# Patient Record
Sex: Female | Born: 1990 | Hispanic: No | Marital: Single | Smoking: Never smoker
Health system: Southern US, Community
[De-identification: ages and names within clinical notes are randomized; demographics above are authoritative.]

## PROBLEM LIST (undated history)

## (undated) DIAGNOSIS — F909 Attention-deficit hyperactivity disorder, unspecified type: Secondary | ICD-10-CM

## (undated) DIAGNOSIS — F329 Major depressive disorder, single episode, unspecified: Secondary | ICD-10-CM

## (undated) DIAGNOSIS — F419 Anxiety disorder, unspecified: Secondary | ICD-10-CM

## (undated) DIAGNOSIS — F32A Depression, unspecified: Secondary | ICD-10-CM

## (undated) HISTORY — DX: Anxiety disorder, unspecified: F41.9

## (undated) HISTORY — DX: Depression, unspecified: F32.A

## (undated) HISTORY — PX: TONSILLECTOMY: SUR1361

## (undated) HISTORY — DX: Major depressive disorder, single episode, unspecified: F32.9

---
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
## 2013-08-08 ENCOUNTER — Encounter: Payer: Self-pay | Admitting: Obstetrics and Gynecology

## 2013-08-08 ENCOUNTER — Encounter: Payer: Self-pay | Admitting: Maternal and Fetal Medicine

## 2013-08-18 ENCOUNTER — Emergency Department: Payer: Self-pay | Admitting: Emergency Medicine

## 2013-08-18 LAB — URINALYSIS, COMPLETE
Bilirubin,UR: NEGATIVE
Glucose,UR: NEGATIVE mg/dL (ref 0–75)
Ph: 5 (ref 4.5–8.0)
RBC,UR: 44 /HPF (ref 0–5)
WBC UR: 126 /HPF (ref 0–5)

## 2013-08-18 LAB — CBC
MCH: 31.2 pg (ref 26.0–34.0)
MCHC: 34.3 g/dL (ref 32.0–36.0)
Platelet: 252 10*3/uL (ref 150–440)
RBC: 4.11 10*6/uL (ref 3.80–5.20)
RDW: 13.7 % (ref 11.5–14.5)
WBC: 10.7 10*3/uL (ref 3.6–11.0)

## 2014-12-26 NOTE — Consult Note (Signed)
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   Past Obstetrical Hx spontaneous vaginal delivery uncomplicated term delivery 7lbs 12oz   Home Medications:  Medication Instructions Status  Prenatal Multivitamins Prenatal Multivitamins oral tablet 1 tab(s) orally once a day Active   Allergies:   No Known Allergies:   Vital Signs/Notes:  Nursing Vital Signs: **Vital Signs.:   04-Dec-14 14:11  Vital Signs Type Admission  Temperature Temperature (F) 97.7  Celsius 36.5  Temperature Source oral  Pulse Pulse 92  Respirations Respirations 18  Systolic BP Systolic BP 101  Diastolic BP (mmHg) Diastolic BP (mmHg) 66  Mean BP 77  Pulse Ox % Pulse Ox % 99  Pulse Ox Activity Level  At rest  Oxygen Delivery Room Air/ 21 %   Perinatal Consult:  PMed Hx Rubella Immune, Hx of varicella   Past Medical History cont'd s/p flu shot   PSurg Hx T&A   Impression/Recommendations:  Impression IUP at 3038w1d  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 I did  a literature search on pregnancy outcomes after nonsurgical  treatment for cornual  heterotopic pregnancies  - There is a paper in Fertility and Sterility ( 2009 ; 91:e11-e13) from [place] on expectant mgt after an early demise of the heterotpic pregnancy - at time of cesarean for dystocia there was concern that the ectopic was in a state of "pre-rupture" . I advised her that there is not a great deal of experience with this - I think reasonable to attempt a vaginal delivery but manage like a TOLAC with consideration for IV access, type and screen in blood bank and anesthesia availability .   Plan:  Prenatal Diagnosis Options First trimester   Ultrasound at what gestational ages 4528 and 6634 weeks    Total Time Spent with Patient 15 minutes   >50% of visit spent in couseling/coordination of care yes   Office Use Only [id number]  Level 1 (15min) NEW office consult prob focused   Coding Description: MATERNAL CONDITIONS/HISTORY INDICATION(S).   OTHER: heterotopic pregnancy.  Electronic Signatures: Rondall AllegraLivingston, Anedra Penafiel Gresham (MD)  (Signed 04-Dec-14 16:36)  Authored: Referral, Home Medications, Allergies, Vital Signs/Notes, Consult, Impression, Plan, Billing, Coding Description   Last Updated: 04-Dec-14 16:36 by Rondall AllegraLivingston, Mckaylah Bettendorf Gresham (MD)

## 2016-11-30 ENCOUNTER — Emergency Department: Payer: Self-pay

## 2016-11-30 ENCOUNTER — Inpatient Hospital Stay
Admission: EM | Admit: 2016-11-30 | Discharge: 2016-12-03 | DRG: 872 | Disposition: A | Discharge status: Home / Self Care | Payer: Self-pay | Attending: Internal Medicine | Admitting: Internal Medicine

## 2016-11-30 ENCOUNTER — Encounter: Payer: Self-pay | Admitting: Emergency Medicine

## 2016-11-30 DIAGNOSIS — R109 Unspecified abdominal pain: Secondary | ICD-10-CM

## 2016-11-30 DIAGNOSIS — Z79899 Other long term (current) drug therapy: Secondary | ICD-10-CM

## 2016-11-30 DIAGNOSIS — N390 Urinary tract infection, site not specified: Secondary | ICD-10-CM

## 2016-11-30 DIAGNOSIS — N12 Tubulo-interstitial nephritis, not specified as acute or chronic: Secondary | ICD-10-CM

## 2016-11-30 DIAGNOSIS — A419 Sepsis, unspecified organism: Principal | ICD-10-CM | POA: Diagnosis present

## 2016-11-30 DIAGNOSIS — Z87442 Personal history of urinary calculi: Secondary | ICD-10-CM

## 2016-11-30 DIAGNOSIS — N10 Acute tubulo-interstitial nephritis: Secondary | ICD-10-CM | POA: Diagnosis present

## 2016-11-30 DIAGNOSIS — E876 Hypokalemia: Secondary | ICD-10-CM | POA: Diagnosis present

## 2016-11-30 DIAGNOSIS — Z87440 Personal history of urinary (tract) infections: Secondary | ICD-10-CM

## 2016-11-30 DIAGNOSIS — M549 Dorsalgia, unspecified: Secondary | ICD-10-CM | POA: Diagnosis present

## 2016-11-30 DIAGNOSIS — F909 Attention-deficit hyperactivity disorder, unspecified type: Secondary | ICD-10-CM | POA: Diagnosis present

## 2016-11-30 HISTORY — DX: Attention-deficit hyperactivity disorder, unspecified type: F90.9

## 2016-11-30 LAB — CBC WITH DIFFERENTIAL/PLATELET
BASOS ABS: 0 10*3/uL (ref 0–0.1)
Basophils Relative: 0 %
EOS ABS: 0 10*3/uL (ref 0–0.7)
EOS PCT: 0 %
HCT: 38.7 % (ref 35.0–47.0)
HEMOGLOBIN: 13.4 g/dL (ref 12.0–16.0)
LYMPHS ABS: 1.2 10*3/uL (ref 1.0–3.6)
LYMPHS PCT: 6 %
MCH: 30.5 pg (ref 26.0–34.0)
MCHC: 34.7 g/dL (ref 32.0–36.0)
MCV: 87.8 fL (ref 80.0–100.0)
Monocytes Absolute: 1.5 10*3/uL — ABNORMAL HIGH (ref 0.2–0.9)
Monocytes Relative: 8 %
NEUTROS PCT: 86 %
Neutro Abs: 16.4 10*3/uL — ABNORMAL HIGH (ref 1.4–6.5)
PLATELETS: 370 10*3/uL (ref 150–440)
RBC: 4.4 MIL/uL (ref 3.80–5.20)
RDW: 13.2 % (ref 11.5–14.5)
WBC: 19.2 10*3/uL — AB (ref 3.6–11.0)

## 2016-11-30 LAB — LACTIC ACID, PLASMA: LACTIC ACID, VENOUS: 1.8 mmol/L (ref 0.5–1.9)

## 2016-11-30 LAB — COMPREHENSIVE METABOLIC PANEL
ALT: 26 U/L (ref 14–54)
ANION GAP: 9 (ref 5–15)
AST: 43 U/L — ABNORMAL HIGH (ref 15–41)
Albumin: 3.9 g/dL (ref 3.5–5.0)
Alkaline Phosphatase: 60 U/L (ref 38–126)
BUN: 11 mg/dL (ref 6–20)
CALCIUM: 8.9 mg/dL (ref 8.9–10.3)
CHLORIDE: 104 mmol/L (ref 101–111)
CO2: 22 mmol/L (ref 22–32)
CREATININE: 0.98 mg/dL (ref 0.44–1.00)
Glucose, Bld: 149 mg/dL — ABNORMAL HIGH (ref 65–99)
Potassium: 3.9 mmol/L (ref 3.5–5.1)
SODIUM: 135 mmol/L (ref 135–145)
Total Bilirubin: 0.5 mg/dL (ref 0.3–1.2)
Total Protein: 8.2 g/dL — ABNORMAL HIGH (ref 6.5–8.1)

## 2016-11-30 LAB — URINALYSIS, COMPLETE (UACMP) WITH MICROSCOPIC
BILIRUBIN URINE: NEGATIVE
Bacteria, UA: NONE SEEN
Glucose, UA: NEGATIVE mg/dL
Ketones, ur: NEGATIVE mg/dL
Nitrite: POSITIVE — AB
Protein, ur: 30 mg/dL — AB
SPECIFIC GRAVITY, URINE: 1.019 (ref 1.005–1.030)
pH: 7 (ref 5.0–8.0)

## 2016-11-30 MED ORDER — CEFTRIAXONE SODIUM-DEXTROSE 1-3.74 GM-% IV SOLR
1.0000 g | Freq: Once | INTRAVENOUS | Status: AC
  Administered 2016-11-30: 1 g via INTRAVENOUS
  Filled 2016-11-30: qty 50

## 2016-11-30 MED ORDER — SODIUM CHLORIDE 0.9 % IV BOLUS (SEPSIS)
1000.0000 mL | Freq: Once | INTRAVENOUS | Status: AC
  Administered 2016-11-30: 1000 mL via INTRAVENOUS

## 2016-11-30 MED ORDER — FENTANYL CITRATE (PF) 100 MCG/2ML IJ SOLN
INTRAMUSCULAR | Status: AC
  Administered 2016-11-30: 50 ug via INTRAVENOUS
  Filled 2016-11-30: qty 2

## 2016-11-30 MED ORDER — DEXTROSE 5 % IV SOLN: 1.0000 g | Freq: Once | INTRAVENOUS | Status: DC

## 2016-11-30 MED ORDER — ACETAMINOPHEN 500 MG PO TABS
1000.0000 mg | ORAL_TABLET | Freq: Once | ORAL | Status: AC
Start: 2016-11-30 — End: 2016-11-30
  Administered 2016-11-30: 1000 mg via ORAL
  Filled 2016-11-30: qty 2

## 2016-11-30 MED ORDER — MORPHINE SULFATE (PF) 4 MG/ML IV SOLN
4.0000 mg | INTRAVENOUS | Status: DC | PRN
  Administered 2016-11-30: 4 mg via INTRAVENOUS
  Filled 2016-11-30: qty 1

## 2016-11-30 MED ORDER — FENTANYL CITRATE (PF) 100 MCG/2ML IJ SOLN
50.0000 ug | INTRAMUSCULAR | Status: DC | PRN
  Administered 2016-11-30: 50 ug via INTRAVENOUS

## 2016-11-30 MED ORDER — ONDANSETRON HCL 4 MG/2ML IJ SOLN
4.0000 mg | Freq: Once | INTRAMUSCULAR | Status: AC
  Administered 2016-11-30: 4 mg via INTRAVENOUS
  Filled 2016-11-30: qty 2

## 2016-11-30 NOTE — ED Triage Notes (Addendum)
Patient ambulatory to triage with steady gait, without difficulty or distress noted, mask in place; pt reports right flank pain, fever and body aches since last night; st hx kidney infections; took advil 2 tab PTA; charge nurse notified; pt taken to room 5 and placed in hosp gown & on card monitor for further eval; report given to care nurse Vernona RiegerLaura, RN and MD Dr Roxan Hockeyobinson

## 2016-11-30 NOTE — ED Notes (Signed)
Pt transported to Ultrasound.  

## 2016-11-30 NOTE — ED Notes (Signed)
Blood culture draw #1: RIGHT hand; Blood culture draw #2 LEFT hand.

## 2016-11-30 NOTE — ED Notes (Signed)
ED Provider at bedside. 

## 2016-11-30 NOTE — ED Provider Notes (Signed)
[facility] Emergency Department Provider Note    None    (approximate)  I have reviewed the triage vital signs and the nursing notes.   HISTORY  Chief Complaint Fever and Back Pain    HPI Megan Floyd is a 26 y.o. female Modena Jansky with fever draws malaise and acute worsening of right flank pain. Patient states that she's had recurrent UTIs in the past several months. States she's been giving medications of antibiotics but does not remember the name of them. States she also has a history of kidney stone back in 2012.  Patient stayed home from work today due to worsening malaise and pain.   Reported K recheck her this afternoon. Found her to be febrile to 104 and ill-appearing.   History reviewed. No pertinent past medical history. No family history on file. Past Surgical History:  Procedure Laterality Date  . TONSILLECTOMY     There are no active problems to display for this patient.     Prior to Admission medications   Not on File    Allergies Patient has no known allergies.    Social History Social History  Substance Use Topics  . Smoking status: Never Smoker  . Smokeless tobacco: Never Used  . Alcohol use No    Review of Systems Patient denies headaches, rhinorrhea, blurry vision, numbness, shortness of breath, chest pain, edema, cough, abdominal pain, nausea, vomiting, diarrhea, dysuria, fevers, rashes or hallucinations unless otherwise stated above in HPI. ____________________________________________   PHYSICAL EXAM:  VITAL SIGNS: Vitals:   11/30/16 2230 11/30/16 2300  BP: (!) 90/55 99/77  Pulse: (!) 118 (!) 126  Resp: (!) 23 (!) 23  Temp:      Constitutional: Alert and oriented. ill appearing but in no acute distress. Eyes: Conjunctivae are normal. PERRL. EOMI. Head: Atraumatic. Nose: No congestion/rhinnorhea. Mouth/Throat: Mucous membranes are moist.  Oropharynx non-erythematous. Neck: No stridor. Painless ROM. No  cervical spine tenderness to palpation Hematological/Lymphatic/Immunilogical: No cervical lymphadenopathy. Cardiovascular: tachycardic, regular rhythm. Grossly normal heart sounds.  Good peripheral circulation. Respiratory: Normal respiratory effort.  No retractions. Lungs CTAB. Gastrointestinal: Soft and nontender. No distention. No abdominal bruits. + right CVA tenderness. Musculoskeletal: No lower extremity tenderness nor edema.  No joint effusions. Neurologic:  Normal speech and language. No gross focal neurologic deficits are appreciated. No gait instability. Skin:  Skin is warm, dry and intact. No rash noted. Psychiatric: Mood and affect are normal. Speech and behavior are normal.  ____________________________________________   LABS (all labs ordered are listed, but only abnormal results are displayed)  Results for orders placed or performed during the hospital encounter of 11/30/16 (from the past 24 hour(s))  Urinalysis, Complete w Microscopic     Status: Abnormal   Collection Time: 11/30/16  8:43 PM  Result Value Ref Range   Color, Urine AMBER (A) YELLOW   APPearance HAZY (A) CLEAR   Specific Gravity, Urine 1.019 1.005 - 1.030   pH 7.0 5.0 - 8.0   Glucose, UA NEGATIVE NEGATIVE mg/dL   Hgb urine dipstick SMALL (A) NEGATIVE   Bilirubin Urine NEGATIVE NEGATIVE   Ketones, ur NEGATIVE NEGATIVE mg/dL   Protein, ur 30 (A) NEGATIVE mg/dL   Nitrite POSITIVE (A) NEGATIVE   Leukocytes, UA MODERATE (A) NEGATIVE   RBC / HPF 6-30 0 - 5 RBC/hpf   WBC, UA TOO NUMEROUS TO COUNT 0 - 5 WBC/hpf   Bacteria, UA NONE SEEN NONE SEEN   Squamous Epithelial / LPF 6-30 (A) NONE SEEN  Mucous PRESENT    Non Squamous Epithelial 0-5 (A) NONE SEEN  Comprehensive metabolic panel     Status: Abnormal   Collection Time: 11/30/16  9:03 PM  Result Value Ref Range   Sodium 135 135 - 145 mmol/L   Potassium 3.9 3.5 - 5.1 mmol/L   Chloride 104 101 - 111 mmol/L   CO2 22 22 - 32 mmol/L   Glucose, Bld 149 (H)  65 - 99 mg/dL   BUN 11 6 - 20 mg/dL   Creatinine, Ser 4.09 0.44 - 1.00 mg/dL   Calcium 8.9 8.9 - 81.1 mg/dL   Total Protein 8.2 (H) 6.5 - 8.1 g/dL   Albumin 3.9 3.5 - 5.0 g/dL   AST 43 (H) 15 - 41 U/L   ALT 26 14 - 54 U/L   Alkaline Phosphatase 60 38 - 126 U/L   Total Bilirubin 0.5 0.3 - 1.2 mg/dL   GFR calc non Af Amer >60 >60 mL/min   GFR calc Af Amer >60 >60 mL/min   Anion gap 9 5 - 15  Lactic acid, plasma     Status: None   Collection Time: 11/30/16  9:03 PM  Result Value Ref Range   Lactic Acid, Venous 1.8 0.5 - 1.9 mmol/L  CBC with Differential     Status: Abnormal   Collection Time: 11/30/16  9:03 PM  Result Value Ref Range   WBC 19.2 (H) 3.6 - 11.0 K/uL   RBC 4.40 3.80 - 5.20 MIL/uL   Hemoglobin 13.4 12.0 - 16.0 g/dL   HCT 91.4 78.2 - 95.6 %   MCV 87.8 80.0 - 100.0 fL   MCH 30.5 26.0 - 34.0 pg   MCHC 34.7 32.0 - 36.0 g/dL   RDW 21.3 08.6 - 57.8 %   Platelets 370 150 - 440 K/uL   Neutrophils Relative % 86 %   Neutro Abs 16.4 (H) 1.4 - 6.5 K/uL   Lymphocytes Relative 6 %   Lymphs Abs 1.2 1.0 - 3.6 K/uL   Monocytes Relative 8 %   Monocytes Absolute 1.5 (H) 0.2 - 0.9 K/uL   Eosinophils Relative 0 %   Eosinophils Absolute 0.0 0 - 0.7 K/uL   Basophils Relative 0 %   Basophils Absolute 0.0 0 - 0.1 K/uL   ____________________________________________  EKG My review and personal interpretation at Time: 20:57   Indication: tachycardia  Rate: 140  Rhythm: sinus Axis: normal Other: normal intervals, no st elevations or depressions ____________________________________________  RADIOLOGY  I personally reviewed all radiographic images ordered to evaluate for the above acute complaints and reviewed radiology reports and findings.  These findings were personally discussed with the patient.  Please see medical record for radiology report. ____________________________________________   PROCEDURES  Procedure(s) performed:  Procedures    Critical Care performed:  yes CRITICAL CARE Performed by: Willy Eddy   Total critical care time: 30 minutes  Critical care time was exclusive of separately billable procedures and treating other patients.  Critical care was necessary to treat or prevent imminent or life-threatening deterioration.  Critical care was time spent personally by me on the following activities: development of treatment plan with patient and/or surrogate as well as nursing, discussions with consultants, evaluation of patient's response to treatment, examination of patient, obtaining history from patient or surrogate, ordering and performing treatments and interventions, ordering and review of laboratory studies, ordering and review of radiographic studies, pulse oximetry and re-evaluation of patient's condition.  ____________________________________________   INITIAL IMPRESSION / ASSESSMENT AND PLAN /  ED COURSE  Pertinent labs & imaging results that were available during my care of the patient were reviewed by me and considered in my medical decision making (see chart for details).  DDX: pyelo, stone, colitis, flu,   Tammi SouDanielle J Lauderback is a 26 y.o. who presents to the ED with acute flank pain of fever and tachycardia as described above. Patient ill-appearing. Has been having recurrent UTIs and symptoms consistent with UTI. Urinalysis shows evidence of pyuria that is both nitrate positive and leuk esterase positive.  His presentation isn't septic workup was initiated. Ultrasound was ordered to evaluate for any evidence of hydronephrosis. KUB also ordered to evaluate for any evidence of stone. There is no evidence of hydronephrosis or kidney stone. Her abdominal exam soft and benign. Do not feel CT imaging clinically indicated at this time. Given her acute leukocytosis as well as febrile illness and concern for sepsis secondary to UTI with pyelonephritis. Patient given IV antibiotics and multiple doses of IV fluids. Patient with persistent  tachycardia and soft blood pressures but lactate is normal.  Do feel patient will need admission for further evaluation and management.  Have discussed with the patient and available family all diagnostics and treatments performed thus far and all questions were answered to the best of my ability. The patient demonstrates understanding and agreement with plan.       ____________________________________________   FINAL CLINICAL IMPRESSION(S) / ED DIAGNOSES  Final diagnoses:  Sepsis, due to unspecified organism [facility](HCC)  Pyelonephritis      NEW MEDICATIONS STARTED DURING THIS VISIT:  New Prescriptions   No medications on file     Note:  This document was prepared using Dragon voice recognition software and may include unintentional dictation errors.    Willy EddyPatrick Adamariz Gillott, MD 11/30/16 [phone]

## 2016-12-01 ENCOUNTER — Inpatient Hospital Stay: Payer: Self-pay

## 2016-12-01 DIAGNOSIS — A419 Sepsis, unspecified organism: Secondary | ICD-10-CM

## 2016-12-01 DIAGNOSIS — N390 Urinary tract infection, site not specified: Secondary | ICD-10-CM

## 2016-12-01 HISTORY — DX: Urinary tract infection, site not specified: A41.9

## 2016-12-01 LAB — APTT: aPTT: 37 seconds — ABNORMAL HIGH (ref 24–36)

## 2016-12-01 LAB — CBC WITH DIFFERENTIAL/PLATELET
BASOS ABS: 0.1 10*3/uL (ref 0–0.1)
BASOS PCT: 0 %
Eosinophils Absolute: 0 10*3/uL (ref 0–0.7)
Eosinophils Relative: 0 %
HEMATOCRIT: 31.9 % — AB (ref 35.0–47.0)
Hemoglobin: 11 g/dL — ABNORMAL LOW (ref 12.0–16.0)
LYMPHS PCT: 12 %
Lymphs Abs: 2.1 10*3/uL (ref 1.0–3.6)
MCH: 30.8 pg (ref 26.0–34.0)
MCHC: 34.6 g/dL (ref 32.0–36.0)
MCV: 89.2 fL (ref 80.0–100.0)
MONOS PCT: 10 %
Monocytes Absolute: 1.6 10*3/uL — ABNORMAL HIGH (ref 0.2–0.9)
NEUTROS ABS: 13.4 10*3/uL — AB (ref 1.4–6.5)
NEUTROS PCT: 78 %
Platelets: 284 10*3/uL (ref 150–440)
RBC: 3.58 MIL/uL — ABNORMAL LOW (ref 3.80–5.20)
RDW: 12.9 % (ref 11.5–14.5)
WBC: 17.2 10*3/uL — ABNORMAL HIGH (ref 3.6–11.0)

## 2016-12-01 LAB — COMPREHENSIVE METABOLIC PANEL
ALBUMIN: 3 g/dL — AB (ref 3.5–5.0)
ALK PHOS: 42 U/L (ref 38–126)
ALT: 17 U/L (ref 14–54)
AST: 25 U/L (ref 15–41)
Anion gap: 5 (ref 5–15)
BILIRUBIN TOTAL: 0.6 mg/dL (ref 0.3–1.2)
BUN: 7 mg/dL (ref 6–20)
CALCIUM: 7.4 mg/dL — AB (ref 8.9–10.3)
CO2: 23 mmol/L (ref 22–32)
Chloride: 110 mmol/L (ref 101–111)
Creatinine, Ser: 0.85 mg/dL (ref 0.44–1.00)
GFR calc Af Amer: >60 mL/min (ref >60)
GLUCOSE: 124 mg/dL — AB (ref 65–99)
Potassium: 3.3 mmol/L — ABNORMAL LOW (ref 3.5–5.1)
Sodium: 138 mmol/L (ref 135–145)
TOTAL PROTEIN: 6.2 g/dL — AB (ref 6.5–8.1)

## 2016-12-01 LAB — POCT PREGNANCY, URINE: Preg Test, Ur: NEGATIVE

## 2016-12-01 LAB — PROTIME-INR
INR: 1.18
Prothrombin Time: 15.1 seconds (ref 11.4–15.2)

## 2016-12-01 LAB — PROCALCITONIN: Procalcitonin: 0.25 ng/mL

## 2016-12-01 MED ORDER — AMPHETAMINE-DEXTROAMPHETAMINE 5 MG PO TABS
30.0000 mg | ORAL_TABLET | Freq: Two times a day (BID) | ORAL | Status: DC
  Filled 2016-12-01: qty 6

## 2016-12-01 MED ORDER — ONDANSETRON HCL 4 MG PO TABS: 4.0000 mg | ORAL_TABLET | Freq: Four times a day (QID) | ORAL | Status: DC | PRN

## 2016-12-01 MED ORDER — ACETAMINOPHEN 650 MG RE SUPP: 650.0000 mg | Freq: Four times a day (QID) | RECTAL | Status: DC | PRN

## 2016-12-01 MED ORDER — SODIUM CHLORIDE 0.9 % IV BOLUS (SEPSIS): 1000.0000 mL | Freq: Once | INTRAVENOUS | Status: AC

## 2016-12-01 MED ORDER — HYDROMORPHONE HCL 1 MG/ML IJ SOLN: 1.0000 mg | INTRAMUSCULAR | Status: DC | PRN

## 2016-12-01 MED ORDER — SODIUM CHLORIDE 0.9 % IV SOLN
INTRAVENOUS | Status: DC
  Administered 2016-12-01 – 2016-12-03 (×5): via INTRAVENOUS

## 2016-12-01 MED ORDER — POTASSIUM CHLORIDE CRYS ER 20 MEQ PO TBCR
40.0000 meq | EXTENDED_RELEASE_TABLET | Freq: Once | ORAL | Status: AC
  Administered 2016-12-01: 40 meq via ORAL
  Filled 2016-12-01: qty 2

## 2016-12-01 MED ORDER — NORETHINDRONE ACET-ETHINYL EST 1-20 MG-MCG PO TABS
1.0000 | ORAL_TABLET | Freq: Every day | ORAL | Status: DC
  Administered 2016-12-01 – 2016-12-02 (×2): 1 via ORAL
  Filled 2016-12-01 (×2): qty 1

## 2016-12-01 MED ORDER — CEFTRIAXONE SODIUM-DEXTROSE 2-2.22 GM-% IV SOLR
2.0000 g | INTRAVENOUS | Status: DC
  Administered 2016-12-01: 2 g via INTRAVENOUS
  Filled 2016-12-01 (×2): qty 50

## 2016-12-01 MED ORDER — BISACODYL 5 MG PO TBEC: 5.0000 mg | DELAYED_RELEASE_TABLET | Freq: Every day | ORAL | Status: DC | PRN

## 2016-12-01 MED ORDER — PHENAZOPYRIDINE HCL 100 MG PO TABS
100.0000 mg | ORAL_TABLET | Freq: Three times a day (TID) | ORAL | Status: DC
  Administered 2016-12-01 – 2016-12-03 (×6): 100 mg via ORAL
  Filled 2016-12-01 (×6): qty 1

## 2016-12-01 MED ORDER — ENOXAPARIN SODIUM 40 MG/0.4ML ~~LOC~~ SOLN
40.0000 mg | SUBCUTANEOUS | Status: DC
  Administered 2016-12-01: 23:00:00 40 mg via SUBCUTANEOUS
  Filled 2016-12-01 (×2): qty 0.4

## 2016-12-01 MED ORDER — SENNOSIDES-DOCUSATE SODIUM 8.6-50 MG PO TABS: 1.0000 | ORAL_TABLET | Freq: Every evening | ORAL | Status: DC | PRN

## 2016-12-01 MED ORDER — MORPHINE SULFATE (PF) 2 MG/ML IV SOLN
1.0000 mg | INTRAVENOUS | Status: DC | PRN
  Administered 2016-12-01: 05:00:00 1 mg via INTRAVENOUS
  Filled 2016-12-01: qty 1

## 2016-12-01 MED ORDER — CEFTRIAXONE SODIUM 2 G IJ SOLR
2.0000 g | Freq: Once | INTRAMUSCULAR | Status: DC
  Filled 2016-12-01: qty 2

## 2016-12-01 MED ORDER — ACETAMINOPHEN 325 MG PO TABS
650.0000 mg | ORAL_TABLET | Freq: Four times a day (QID) | ORAL | Status: DC | PRN
  Administered 2016-12-01 (×2): 650 mg via ORAL
  Filled 2016-12-01 (×3): qty 2

## 2016-12-01 MED ORDER — ALBUTEROL SULFATE (2.5 MG/3ML) 0.083% IN NEBU: 2.5000 mg | INHALATION_SOLUTION | Freq: Four times a day (QID) | RESPIRATORY_TRACT | Status: DC | PRN

## 2016-12-01 MED ORDER — AMPHETAMINE-DEXTROAMPHETAMINE 30 MG PO TABS
1.0000 | ORAL_TABLET | Freq: Two times a day (BID) | ORAL | Status: DC
  Filled 2016-12-01 (×2): qty 1

## 2016-12-01 MED ORDER — ONDANSETRON HCL 4 MG/2ML IJ SOLN
4.0000 mg | Freq: Four times a day (QID) | INTRAMUSCULAR | Status: DC | PRN
  Administered 2016-12-02: 18:00:00 4 mg via INTRAVENOUS
  Filled 2016-12-01: qty 2

## 2016-12-01 MED ORDER — IPRATROPIUM BROMIDE 0.02 % IN SOLN: 0.5000 mg | Freq: Four times a day (QID) | RESPIRATORY_TRACT | Status: DC | PRN

## 2016-12-01 MED ORDER — RISAQUAD PO CAPS
2.0000 | ORAL_CAPSULE | Freq: Every day | ORAL | Status: DC
  Administered 2016-12-01 – 2016-12-03 (×3): 2 via ORAL
  Filled 2016-12-01 (×3): qty 2

## 2016-12-01 MED ORDER — MAGNESIUM CITRATE PO SOLN
1.0000 | Freq: Once | ORAL | Status: DC | PRN
  Filled 2016-12-01: qty 296

## 2016-12-01 MED ORDER — HYDROMORPHONE HCL 1 MG/ML PO LIQD: 1.0000 mg | ORAL | Status: DC | PRN

## 2016-12-01 MED ORDER — KETOROLAC TROMETHAMINE 30 MG/ML IJ SOLN
30.0000 mg | Freq: Four times a day (QID) | INTRAMUSCULAR | Status: DC | PRN
  Administered 2016-12-01 – 2016-12-02 (×6): 30 mg via INTRAVENOUS
  Filled 2016-12-01 (×6): qty 1

## 2016-12-01 MED ORDER — CEFTRIAXONE SODIUM-DEXTROSE 1-3.74 GM-% IV SOLR
1.0000 g | Freq: Once | INTRAVENOUS | Status: AC
  Administered 2016-12-01: 04:00:00 1 g via INTRAVENOUS
  Filled 2016-12-01 (×3): qty 50

## 2016-12-01 MED ORDER — OXYCODONE HCL 5 MG PO TABS
5.0000 mg | ORAL_TABLET | ORAL | Status: DC | PRN
  Administered 2016-12-01 – 2016-12-03 (×4): 5 mg via ORAL
  Filled 2016-12-01 (×4): qty 1

## 2016-12-01 NOTE — Progress Notes (Signed)
Pharmacy Antibiotic Note  Megan Floyd is a 26 y.o. female admitted on 11/30/2016 with Urosepsis.  Pharmacy has been consulted for Ceftriaxone dosing.  Plan: Patient received CTX 1g IV x 1 in ED will give another 1g to complete a 2g dose then: Ceftriaxone 2g IV daily  Height: 5\' 5"  (165.1 cm) Weight: 140 lb (63.5 kg) IBW/kg (Calculated) : 57  Temp (24hrs), Avg:101.2 F (38.4 C), Min:99.3 F (37.4 C), Max:103.1 F (39.5 C)   Recent Labs Lab 11/30/16 2103  WBC 19.2*  CREATININE 0.98  LATICACIDVEN 1.8    Estimated Creatinine Clearance: 79.7 mL/min (by C-G formula based on SCr of 0.98 mg/dL).    No Known Allergies   Thank you for allowing pharmacy to be a part of this patient's care.  Thomasene Rippleavid Avonna Iribe, PharmD, BCPS Clinical Pharmacist 12/01/2016

## 2016-12-01 NOTE — Plan of Care (Signed)
Problem: Pain Managment: Goal: General experience of comfort will improve Outcome: Not Progressing Pt c/o of burning with urination and severe back pain.

## 2016-12-01 NOTE — Progress Notes (Signed)
[employer] - [place] at [facility]   PATIENT NAME: Megan Floyd    MR#:  [id number]  DATE OF BIRTH:  12/11/1990  SUBJECTIVE:  CHIEF COMPLAINT:   Chief Complaint  Patient presents with  . Fever  . Back Pain   - admitted with fevers and right flank pain - still febrile this AM, has dysuria as well  REVIEW OF SYSTEMS:  Review of Systems  Constitutional: Positive for chills, fever and malaise/fatigue.  HENT: Negative for congestion, ear discharge, hearing loss and nosebleeds.   Eyes: Negative for blurred vision and double vision.  Respiratory: Negative for cough, shortness of breath and wheezing.   Cardiovascular: Negative for chest pain, palpitations and leg swelling.  Gastrointestinal: Positive for abdominal pain. Negative for constipation, diarrhea, nausea and vomiting.  Genitourinary: Positive for dysuria and flank pain.  Musculoskeletal: Positive for back pain and myalgias.  Neurological: Negative for dizziness, speech change, focal weakness, seizures and headaches.  Psychiatric/Behavioral: Negative for depression.    DRUG ALLERGIES:  No Known Allergies  VITALS:  Blood pressure (!) 99/52, pulse (!) 101, temperature 98.3 F (36.8 C), temperature source Oral, resp. rate 16, height  (1.651 m), weight 63.5 kg (140 lb), last menstrual period 10/02/2016, SpO2 95 %.  PHYSICAL EXAMINATION:  Physical Exam  GENERAL:  26 y.o.-year-old patient lying in the bed with no acute distress.  EYES: Pupils equal, round, reactive to light and accommodation. No scleral icterus. Extraocular muscles intact.  HEENT: Head atraumatic, normocephalic. Oropharynx and nasopharynx clear.  NECK:  Supple, no jugular venous distention. No thyroid enlargement, no tenderness.  LUNGS: Normal breath sounds bilaterally, no wheezing, rales,rhonchi or crepitation. No use of accessory muscles of respiration.  CARDIOVASCULAR: S1, S2 normal. No murmurs, rubs, or gallops.  ABDOMEN: Soft,  right flank pain and right CVA tenderness, otherwise nontender, nondistended. Bowel sounds present. No organomegaly or mass.  EXTREMITIES: No pedal edema, cyanosis, or clubbing.  NEUROLOGIC: Cranial nerves II through XII are intact. Muscle strength 5/5 in all extremities. Sensation intact. Gait not checked.  PSYCHIATRIC: The patient is alert and oriented x 3.  SKIN: No obvious rash, lesion, or ulcer.    LABORATORY PANEL:   CBC  Recent Labs Lab 12/01/16 0242  WBC 17.2*  HGB 11.0*  HCT 31.9*  PLT 284   ------------------------------------------------------------------------------------------------------------------  Chemistries   Recent Labs Lab 12/01/16 0242  NA 138  K 3.3*  CL 110  CO2 23  GLUCOSE 124*  BUN 7  CREATININE 0.85  CALCIUM 7.4*  AST 25  ALT 17  ALKPHOS 42  BILITOT 0.6   ------------------------------------------------------------------------------------------------------------------  Cardiac Enzymes No results for input(s): TROPONINI in the last 168 hours. ------------------------------------------------------------------------------------------------------------------  RADIOLOGY:  Dg Abdomen 1 View  Result Date: 11/30/2016 CLINICAL DATA:  Acute onset of right flank pain. Fever and body aches. Initial encounter. EXAM: ABDOMEN - 1 VIEW COMPARISON:  CT of the abdomen and pelvis from 02/21/2011 FINDINGS: The visualized bowel gas pattern is unremarkable. Scattered air and stool filled loops of colon are seen; no abnormal dilatation of small bowel loops is seen to suggest small bowel obstruction. No free intra-abdominal air is identified, though evaluation for free air is limited on a single supine view. Evaluation for ureteral stones is limited given overlying stool. The visualized osseous structures are within normal limits; the sacroiliac joints are unremarkable in appearance. The visualized lung bases are essentially clear. IMPRESSION: Unremarkable bowel gas  pattern; no free intra-abdominal air seen. Moderate amount of stool noted in  the colon. Evaluation for ureteral stones is limited given overlying stool. Electronically Signed   By: Roanna Raider M.D.   On: 11/30/2016 23:23   US Renal  Result Date: 11/30/2016 CLINICAL DATA:  Initial evaluation for acute right flank pain. History of kidney stones. EXAM: RENAL / URINARY TRACT ULTRASOUND COMPLETE COMPARISON:  None. FINDINGS: Right Kidney: Length: 12.5 cm. Echogenicity within normal limits. No mass or hydronephrosis visualized. No appreciable nephrolithiasis. Left Kidney: Length: 12.1 cm. Echogenicity within normal limits. No mass or hydronephrosis visualized. No appreciable nephrolithiasis. Bladder: Appears normal for degree of bladder distention. IMPRESSION: Normal renal ultrasound.  No nephrolithiasis or hydronephrosis. Electronically Signed   By: Rise Mu M.D.   On: 11/30/2016 23:09    EKG:   Orders placed or performed during the hospital encounter of 11/30/16  . EKG 12-Lead  . EKG 12-Lead  . EKG 12-Lead    ASSESSMENT AND PLAN:   27 year old female with past medical history significant for ADHD, prior history of UTIs presents to hospital secondary to fevers and chills.  #1 sepsis-secondary to acute pyelonephritis of the right. -Urine cultures and blood cultures are pending. -Continue pain medications. Currently on Rocephin. -Febrile this morning as well. WBC with slight improvement only.  #2 hypokalemia-being replaced  #3 ADHD-continue Adderall  #4 DVT prophylaxis-started on Lovenox.   All the records are reviewed and case discussed with Care Management/Social Workerr. Management plans discussed with the patient, family and they are in agreement.  CODE STATUS: Full Code  TOTAL TIME TAKING CARE OF THIS PATIENT: 36 minutes.   POSSIBLE D/C IN 1-2 DAYS, DEPENDING ON CLINICAL CONDITION.   Enid Baas M.D on 12/01/2016 at 1:44 PM  Between 7am to 6pm - Pager -  [phone]  After 6pm go to [email or web address] - Social research officer, government  [facility]  Office  [phone]  CC: Primary care physician; [facility]

## 2016-12-01 NOTE — Plan of Care (Signed)
Problem: Urinary Elimination: Goal: Signs and symptoms of infection will decrease Outcome: Not Progressing Pt c/o burning with urination and severe back pain. Pyridium and toradol given.

## 2016-12-01 NOTE — H&P (Signed)
History and Physical   [facility] - [place] @ [facility] Admission History and Physical [employer], D.O.    Patient Name: Megan Floyd MR#: [id number] Date of Birth: [birth year] Date of Admission: 11/30/2016  Referring MD/NP/PA: Dr. Roxan Hockey Primary Care Physician: [facility] Patient coming from: Home Outpatient Specialists: None   Chief Complaint:  Chief Complaint  Patient presents with  . Fever  . Back Pain    HPI: Megan Floyd is a 26 y.o. female with ADHD  presents to the emergency department for evaluation of back pain.  Patient was in a usual state of health until The past day when she developed right-sided flank and abdominal pain associated with fevers and chills. She states that she has had multiple urinary tract infections including 2 in the past 6 months or which is been treated with antibiotics. She has also had kidney stones in the past.. Patient denies weakness, dizziness, chest pain, shortness of breath, N/V/C/D, abdominal pain, dysuria/frequency, changes in mental status.    Otherwise there has been no change in status. Patient has been taking medication as prescribed and there has been no recent change in medication or diet.  No recent antibiotics.  There has been no recent illness, hospitalizations, travel or sick contacts.   In the emergency department patient was found to have signs and symptoms consistent with pyelonephritis. She received Rocephin, fentanyl, morphine, Zofran and normal saline.   Review of Systems:  CONSTITUTIONAL: Positive fever and chills., fatigue, weakness, weight gain/loss, headache. EYES: No blurry or double vision. ENT: No tinnitus, postnasal drip, redness or soreness of the oropharynx. RESPIRATORY: No cough, dyspnea, wheeze.  No hemoptysis.  CARDIOVASCULAR: No chest pain, palpitations, syncope, orthopnea. No lower extremity edema.  GASTROINTESTINAL: No nausea, vomiting, abdominal pain, diarrhea,  constipation.  No hematemesis, melena or hematochezia. GENITOURINARY: No dysuria, frequency, hematuria. Positive right flank pain radiating to the right lower quadrant ENDOCRINE: No polyuria or nocturia. No heat or cold intolerance. HEMATOLOGY: No anemia, bruising, bleeding. INTEGUMENTARY: No rashes, ulcers, lesions. MUSCULOSKELETAL: No arthritis, gout, dyspnea. NEUROLOGIC: No numbness, tingling, ataxia, seizure-type activity, weakness. PSYCHIATRIC: No anxiety, depression, insomnia.   History reviewed. No pertinent past medical history.  Past Surgical History:  Procedure Laterality Date  . TONSILLECTOMY       reports that she has never smoked. She has never used smokeless tobacco. She reports that she does not drink alcohol. Her drug history is not on file.  No Known Allergies  No family history on file.  Prior to Admission medications   Medication Sig Start Date End Date Taking? Authorizing Provider  amphetamine-dextroamphetamine (ADDERALL) 30 MG tablet Take 1 tablet by mouth 2 (two) times daily. 11/24/16  Yes Historical Provider, MD  JUNEL 1/20 1-20 MG-MCG tablet Take 1 tablet by mouth daily. 11/04/16  Yes Historical Provider, MD    Physical Exam: Vitals:   11/30/16 2208 11/30/16 2230 11/30/16 2300 12/01/16 0004  BP: 93/62 (!) 90/55 99/77 (!) 94/53  Pulse: (!) 119 (!) 118 (!) 126 (!) 134  Resp: 19 (!) 23 (!) 23 19  Temp:      TempSrc:      SpO2: 97% 98% 95% 95%  Weight:      Height:        GENERAL: 26 y.o.-year-old female patient, well-developed, well-nourished lying in the bed in no acute distress.  Pleasant and cooperative.   HEENT: Head atraumatic, normocephalic. Pupils equal, round, reactive to light and accommodation. No scleral icterus. Extraocular muscles intact. Nares are  patent. Oropharynx is clear. Mucus membranes moist. NECK: Supple, full range of motion. No JVD, no bruit heard. No thyroid enlargement, no tenderness, no cervical lymphadenopathy. CHEST: Normal  breath sounds bilaterally. No wheezing, rales, rhonchi or crackles. No use of accessory muscles of respiration.  No reproducible chest wall tenderness.  CARDIOVASCULAR: S1, S2 normal. No murmurs, rubs, or gallops. Cap refill <2 seconds. Pulses intact distally.  ABDOMEN: Positive right-sided CVA tenderness Soft, nondistended, nontender. No rebound, guarding, rigidity. Normoactive bowel sounds present in all four quadrants. No organomegaly or mass. EXTREMITIES: No pedal edema, cyanosis, or clubbing. No calf tenderness or Homan's sign.  NEUROLOGIC: The patient is alert and oriented x 3. Cranial nerves II through XII are grossly intact with no focal sensorimotor deficit. Muscle strength 5/5 in all extremities. Sensation intact. Gait not checked. PSYCHIATRIC:  Normal affect, mood, thought content. SKIN: Warm, dry, and intact without obvious rash, lesion, or ulcer.    Labs on Admission:  CBC:  Recent Labs Lab 11/30/16 2103  WBC 19.2*  NEUTROABS 16.4*  HGB 13.4  HCT 38.7  MCV 87.8  PLT 370   Basic Metabolic Panel:  Recent Labs Lab 11/30/16 2103  NA 135  K 3.9  CL 104  CO2 22  GLUCOSE 149*  BUN 11  CREATININE 0.98  CALCIUM 8.9   GFR: Estimated Creatinine Clearance: 79.7 mL/min (by C-G formula based on SCr of 0.98 mg/dL). Liver Function Tests:  Recent Labs Lab 11/30/16 2103  AST 43*  ALT 26  ALKPHOS 60  BILITOT 0.5  PROT 8.2*  ALBUMIN 3.9   No results for input(s): LIPASE, AMYLASE in the last 168 hours. No results for input(s): AMMONIA in the last 168 hours. Coagulation Profile: No results for input(s): INR, PROTIME in the last 168 hours. Cardiac Enzymes: No results for input(s): CKTOTAL, CKMB, CKMBINDEX, TROPONINI in the last 168 hours. BNP (last 3 results) No results for input(s): PROBNP in the last 8760 hours. HbA1C: No results for input(s): HGBA1C in the last 72 hours. CBG: No results for input(s): GLUCAP in the last 168 hours. Lipid Profile: No results for  input(s): CHOL, HDL, LDLCALC, TRIG, CHOLHDL, LDLDIRECT in the last 72 hours. Thyroid Function Tests: No results for input(s): TSH, T4TOTAL, FREET4, T3FREE, THYROIDAB in the last 72 hours. Anemia Panel: No results for input(s): VITAMINB12, FOLATE, FERRITIN, TIBC, IRON, RETICCTPCT in the last 72 hours. Urine analysis:    Component Value Date/Time   COLORURINE AMBER (A) 11/30/2016 2043   APPEARANCEUR HAZY (A) 11/30/2016 2043   APPEARANCEUR Hazy 08/18/2013 2222   LABSPEC 1.019 11/30/2016 2043   LABSPEC 1.020 08/18/2013 2222   PHURINE 7.0 11/30/2016 2043   GLUCOSEU NEGATIVE 11/30/2016 2043   GLUCOSEU Negative 08/18/2013 2222   HGBUR SMALL (A) 11/30/2016 2043   BILIRUBINUR NEGATIVE 11/30/2016 2043   BILIRUBINUR Negative 08/18/2013 2222   KETONESUR NEGATIVE 11/30/2016 2043   PROTEINUR 30 (A) 11/30/2016 2043   NITRITE POSITIVE (A) 11/30/2016 2043   LEUKOCYTESUR MODERATE (A) 11/30/2016 2043   LEUKOCYTESUR 2+ 08/18/2013 2222   Sepsis Labs: @LABRCNTIP (procalcitonin:4,lacticidven:4) )No results found for this or any previous visit (from the past 240 hour(s)).   Radiological Exams on Admission: Dg Abdomen 1 View  Result Date: 11/30/2016 CLINICAL DATA:  Acute onset of right flank pain. Fever and body aches. Initial encounter. EXAM: ABDOMEN - 1 VIEW COMPARISON:  CT of the abdomen and pelvis from 02/21/2011 FINDINGS: The visualized bowel gas pattern is unremarkable. Scattered air and stool filled loops of colon are seen; no  abnormal dilatation of small bowel loops is seen to suggest small bowel obstruction. No free intra-abdominal air is identified, though evaluation for free air is limited on a single supine view. Evaluation for ureteral stones is limited given overlying stool. The visualized osseous structures are within normal limits; the sacroiliac joints are unremarkable in appearance. The visualized lung bases are essentially clear. IMPRESSION: Unremarkable bowel gas pattern; no free  intra-abdominal air seen. Moderate amount of stool noted in the colon. Evaluation for ureteral stones is limited given overlying stool. Electronically Signed   By: Roanna Raider M.D.   On: 11/30/2016 23:23   US Renal  Result Date: 11/30/2016 CLINICAL DATA:  Initial evaluation for acute right flank pain. History of kidney stones. EXAM: RENAL / URINARY TRACT ULTRASOUND COMPLETE COMPARISON:  None. FINDINGS: Right Kidney: Length: 12.5 cm. Echogenicity within normal limits. No mass or hydronephrosis visualized. No appreciable nephrolithiasis. Left Kidney: Length: 12.1 cm. Echogenicity within normal limits. No mass or hydronephrosis visualized. No appreciable nephrolithiasis. Bladder: Appears normal for degree of bladder distention. IMPRESSION: Normal renal ultrasound.  No nephrolithiasis or hydronephrosis. Electronically Signed   By: Rise Mu M.D.   On: 11/30/2016 23:09     Assessment/Plan  This is a 26 y.o. female with no past medical history now being admitted with:  #. Sepsis secondary to pyelo - Admit inpatient - IV Rocephin, by mouth Bacid - IV fluids - Pain control - Follow up blood and urine cultures  #. History of ADHD - Continue Adderall  #. Continue OCP  Admission status: Inpatient IV Fluids: NS Diet/Nutrition: Regular Consults called: None  DVT Px: SCDs and early ambulation. Code Status: Full Code  Disposition Plan: To home in 1-2 days  All the records are reviewed and case discussed with ED provider. Management plans discussed with the patient who expresses understanding and agree with plan of care.  Sheva Mcdougle D.O. on 12/01/2016 at 12:23 AM Between 7am to 6pm - Pager - [phone] After 6pm go to [email or web address] - Social research officer, government [facility] [place] [facility] Office [phone] CC: Primary care physician; [facility]   12/01/2016, 12:23 AM

## 2016-12-01 NOTE — ED Notes (Signed)
Admitting MD at bedside.

## 2016-12-02 ENCOUNTER — Inpatient Hospital Stay: Payer: Self-pay

## 2016-12-02 ENCOUNTER — Encounter: Payer: Self-pay | Admitting: Radiology

## 2016-12-02 LAB — BASIC METABOLIC PANEL
ANION GAP: 4 — AB (ref 5–15)
BUN: 7 mg/dL (ref 6–20)
CALCIUM: 8 mg/dL — AB (ref 8.9–10.3)
CO2: 23 mmol/L (ref 22–32)
CREATININE: 0.59 mg/dL (ref 0.44–1.00)
Chloride: 110 mmol/L (ref 101–111)
GFR calc non Af Amer: >60 mL/min (ref >60)
Glucose, Bld: 122 mg/dL — ABNORMAL HIGH (ref 65–99)
Potassium: 3.6 mmol/L (ref 3.5–5.1)
SODIUM: 137 mmol/L (ref 135–145)

## 2016-12-02 LAB — URINE CULTURE

## 2016-12-02 LAB — CBC
HEMATOCRIT: 31 % — AB (ref 35.0–47.0)
HEMOGLOBIN: 10.6 g/dL — AB (ref 12.0–16.0)
MCH: 30.9 pg (ref 26.0–34.0)
MCHC: 34.2 g/dL (ref 32.0–36.0)
MCV: 90.3 fL (ref 80.0–100.0)
Platelets: 284 10*3/uL (ref 150–440)
RBC: 3.43 MIL/uL — ABNORMAL LOW (ref 3.80–5.20)
RDW: 13.2 % (ref 11.5–14.5)
WBC: 17.7 10*3/uL — AB (ref 3.6–11.0)

## 2016-12-02 LAB — HIV ANTIBODY (ROUTINE TESTING W REFLEX): HIV SCREEN 4TH GENERATION: NONREACTIVE

## 2016-12-02 LAB — PROCALCITONIN: PROCALCITONIN: 0.15 ng/mL

## 2016-12-02 MED ORDER — MEROPENEM-SODIUM CHLORIDE 1 GM/50ML IV SOLR
1.0000 g | Freq: Three times a day (TID) | INTRAVENOUS | Status: DC
  Administered 2016-12-02 – 2016-12-03 (×3): 1 g via INTRAVENOUS
  Filled 2016-12-02 (×6): qty 50

## 2016-12-02 MED ORDER — CEFTRIAXONE SODIUM-DEXTROSE 2-2.22 GM-% IV SOLR
2.0000 g | INTRAVENOUS | Status: DC
  Filled 2016-12-02: qty 50

## 2016-12-02 MED ORDER — IOPAMIDOL (ISOVUE-300) INJECTION 61%
100.0000 mL | Freq: Once | INTRAVENOUS | Status: AC | PRN
  Administered 2016-12-02: 100 mL via INTRAVENOUS

## 2016-12-02 MED ORDER — IOPAMIDOL (ISOVUE-300) INJECTION 61%
30.0000 mL | Freq: Once | INTRAVENOUS | Status: AC
  Administered 2016-12-02: 30 mL via ORAL

## 2016-12-02 NOTE — Progress Notes (Signed)
Room air. Takes meds ok. Up to br and tolerated it well. Pt reported back pain and received pain meds. A &o. Pt has no further concerns at this time.

## 2016-12-02 NOTE — Progress Notes (Signed)
Assumed care of patient from DF, RN at 1500. Bo Mcclintock, RN

## 2016-12-02 NOTE — Progress Notes (Signed)
Pharmacy Antibiotic Note  Megan Floyd is a 26 y.o. female admitted on 11/30/2016 with Urosepsis.  Pharmacy has been consulted for Ceftriaxone dosing.  Plan: Ceftriaxone 2g IV daily continued. Continue to f/u on urine cultures and sensitivities.  Height:  (165.1 cm) Weight: 140 lb (63.5 kg) IBW/kg (Calculated) : 57  Temp (24hrs), Avg:99.4 F (37.4 C), Min:98.1 F (36.7 C), Max:102.5 F (39.2 C)   Recent Labs Lab 11/30/16 2103 12/01/16 0242 12/02/16 0433  WBC 19.2* 17.2* 17.7*  CREATININE 0.98 0.85 0.59  LATICACIDVEN 1.8  --   --     Estimated Creatinine Clearance: 97.6 mL/min (by C-G formula based on SCr of 0.59 mg/dL).    No Known Allergies   Thank you for allowing pharmacy to be a part of this patient's care.  Gardner Candle, PharmD, BCPS Clinical Pharmacist 12/02/2016 8:29 AM

## 2016-12-02 NOTE — Progress Notes (Signed)
[facility] - [place] at [facility]   PATIENT NAME: Megan Floyd    MR#:  [id number]  DATE OF BIRTH:  May 23, 1991  SUBJECTIVE:  CHIEF COMPLAINT:   Chief Complaint  Patient presents with  . Fever  . Back Pain   - No fevers since yesterday morning. Still has some dysuria and right flank pain, especially on moving.  REVIEW OF SYSTEMS:  Review of Systems  Constitutional: Negative for chills, fever and malaise/fatigue.  HENT: Negative for congestion, ear discharge, hearing loss and nosebleeds.   Eyes: Negative for blurred vision and double vision.  Respiratory: Negative for cough, shortness of breath and wheezing.   Cardiovascular: Negative for chest pain, palpitations and leg swelling.  Gastrointestinal: Positive for abdominal pain. Negative for constipation, diarrhea, nausea and vomiting.  Genitourinary: Positive for dysuria and flank pain.  Musculoskeletal: Positive for back pain. Negative for myalgias.  Neurological: Negative for dizziness, speech change, focal weakness, seizures and headaches.  Psychiatric/Behavioral: Negative for depression.    DRUG ALLERGIES:  No Known Allergies  VITALS:  Blood pressure (!) 94/48, pulse (!) 108, temperature 98.1 F (36.7 C), temperature source Oral, resp. rate 16, height  (1.651 m), weight 63.5 kg (140 lb), last menstrual period 10/02/2016, SpO2 95 %.  PHYSICAL EXAMINATION:  Physical Exam  GENERAL:  26 y.o.-year-old patient lying in the bed with no acute distress.  EYES: Pupils equal, round, reactive to light and accommodation. No scleral icterus. Extraocular muscles intact.  HEENT: Head atraumatic, normocephalic. Oropharynx and nasopharynx clear.  NECK:  Supple, no jugular venous distention. No thyroid enlargement, no tenderness.  LUNGS: Normal breath sounds bilaterally, no wheezing, rales,rhonchi or crepitation. No use of accessory muscles of respiration.  CARDIOVASCULAR: S1, S2 normal. No murmurs, rubs, or  gallops.  ABDOMEN: Soft, right flank pain and right CVA tenderness, otherwise nontender, nondistended. Bowel sounds present. No organomegaly or mass.  EXTREMITIES: No pedal edema, cyanosis, or clubbing.  NEUROLOGIC: Cranial nerves II through XII are intact. Muscle strength 5/5 in all extremities. Sensation intact. Gait not checked.  PSYCHIATRIC: The patient is alert and oriented x 3.  SKIN: No obvious rash, lesion, or ulcer.    LABORATORY PANEL:   CBC  Recent Labs Lab 12/02/16 0433  WBC 17.7*  HGB 10.6*  HCT 31.0*  PLT 284   ------------------------------------------------------------------------------------------------------------------  Chemistries   Recent Labs Lab 12/01/16 0242 12/02/16 0433  NA 138 137  K 3.3* 3.6  CL 110 110  CO2 23 23  GLUCOSE 124* 122*  BUN 7 7  CREATININE 0.85 0.59  CALCIUM 7.4* 8.0*  AST 25  --   ALT 17  --   ALKPHOS 42  --   BILITOT 0.6  --    ------------------------------------------------------------------------------------------------------------------  Cardiac Enzymes No results for input(s): TROPONINI in the last 168 hours. ------------------------------------------------------------------------------------------------------------------  RADIOLOGY:  Dg Abdomen 1 View  Result Date: 11/30/2016 CLINICAL DATA:  Acute onset of right flank pain. Fever and body aches. Initial encounter. EXAM: ABDOMEN - 1 VIEW COMPARISON:  CT of the abdomen and pelvis from 02/21/2011 FINDINGS: The visualized bowel gas pattern is unremarkable. Scattered air and stool filled loops of colon are seen; no abnormal dilatation of small bowel loops is seen to suggest small bowel obstruction. No free intra-abdominal air is identified, though evaluation for free air is limited on a single supine view. Evaluation for ureteral stones is limited given overlying stool. The visualized osseous structures are within normal limits; the sacroiliac joints are unremarkable in  appearance.  The visualized lung bases are essentially clear. IMPRESSION: Unremarkable bowel gas pattern; no free intra-abdominal air seen. Moderate amount of stool noted in the colon. Evaluation for ureteral stones is limited given overlying stool. Electronically Signed   By: Roanna Raider M.D.   On: 11/30/2016 23:23   US Renal  Result Date: 11/30/2016 CLINICAL DATA:  Initial evaluation for acute right flank pain. History of kidney stones. EXAM: RENAL / URINARY TRACT ULTRASOUND COMPLETE COMPARISON:  None. FINDINGS: Right Kidney: Length: 12.5 cm. Echogenicity within normal limits. No mass or hydronephrosis visualized. No appreciable nephrolithiasis. Left Kidney: Length: 12.1 cm. Echogenicity within normal limits. No mass or hydronephrosis visualized. No appreciable nephrolithiasis. Bladder: Appears normal for degree of bladder distention. IMPRESSION: Normal renal ultrasound.  No nephrolithiasis or hydronephrosis. Electronically Signed   By: Rise Mu M.D.   On: 11/30/2016 23:09    EKG:   Orders placed or performed during the hospital encounter of 11/30/16  . EKG 12-Lead  . EKG 12-Lead  . EKG 12-Lead    ASSESSMENT AND PLAN:   26 year old female with past medical history significant for ADHD, prior history of UTIs presents to hospital secondary to fevers and chills.  #1 sepsis-secondary to acute pyelonephritis on the right. -Urine cultures and blood cultures are pending. -Continue pain medications. Currently on Rocephin. -Fevers are better. WBC is still elevated. And still has flank pain. Pro-calcitonin level on admission was elevated  #2 hypokalemia-replaced  #3 ADHD-continue Adderall-however patient refused to take it in the hospital  #4 DVT prophylaxis-started on Lovenox.  Continue to monitor today, check WBC again in a.m.   All the records are reviewed and case discussed with Care Management/Social Workerr. Management plans discussed with the patient, family and they  are in agreement.  CODE STATUS: Full Code  TOTAL TIME TAKING CARE OF THIS PATIENT: 37 minutes.   POSSIBLE D/C TODAY OR TOMORROW, DEPENDING ON CLINICAL CONDITION.   Daishia Fetterly M.D on 12/02/2016 at 9:49 AM  Between 7am to 6pm - Pager - [phone]  After 6pm go to [email or web address] - Social research officer, government  [facility] [place] [facility]  Office  [phone]  CC: Primary care physician; [facility]

## 2016-12-02 NOTE — Progress Notes (Addendum)
Pharmacy Antibiotic Note  Megan Floyd is a 26 y.o. female admitted on 11/30/2016 with Urosepsis.  Pharmacy was consulted for Ceftriaxone dosing on 3/29. Pharmacy now consulted for meropenem dosing for intra-abdominal infection.   Plan: Will start patient on meropenem 1gm IV every 8 hours.   Height:  (165.1 cm) Weight: 140 lb (63.5 kg) IBW/kg (Calculated) : 57  Temp (24hrs), Avg:99.1 F (37.3 C), Min:98.1 F (36.7 C), Max:100.3 F (37.9 C)   Recent Labs Lab 11/30/16 2103 12/01/16 0242 12/02/16 0433  WBC 19.2* 17.2* 17.7*  CREATININE 0.98 0.85 0.59  LATICACIDVEN 1.8  --   --     Estimated Creatinine Clearance: 97.6 mL/min (by C-G formula based on SCr of 0.59 mg/dL).    No Known Allergies   Thank you for allowing pharmacy to be a part of this patient's care.  Gardner Candle, PharmD, BCPS Clinical Pharmacist 12/02/2016 3:33 PM

## 2016-12-03 LAB — CBC
HCT: 31.1 % — ABNORMAL LOW (ref 35.0–47.0)
HEMOGLOBIN: 10.4 g/dL — AB (ref 12.0–16.0)
MCH: 30.8 pg (ref 26.0–34.0)
MCHC: 33.6 g/dL (ref 32.0–36.0)
MCV: 91.7 fL (ref 80.0–100.0)
PLATELETS: 300 10*3/uL (ref 150–440)
RBC: 3.39 MIL/uL — ABNORMAL LOW (ref 3.80–5.20)
RDW: 13 % (ref 11.5–14.5)
WBC: 9.4 10*3/uL (ref 3.6–11.0)

## 2016-12-03 LAB — BASIC METABOLIC PANEL
Anion gap: 4 — ABNORMAL LOW (ref 5–15)
BUN: 9 mg/dL (ref 6–20)
CALCIUM: 8.2 mg/dL — AB (ref 8.9–10.3)
CO2: 24 mmol/L (ref 22–32)
CREATININE: 0.63 mg/dL (ref 0.44–1.00)
Chloride: 111 mmol/L (ref 101–111)
GFR calc non Af Amer: >60 mL/min (ref >60)
Glucose, Bld: 113 mg/dL — ABNORMAL HIGH (ref 65–99)
Potassium: 3.3 mmol/L — ABNORMAL LOW (ref 3.5–5.1)
SODIUM: 139 mmol/L (ref 135–145)

## 2016-12-03 MED ORDER — CEFDINIR 300 MG PO CAPS: 300.0000 mg | ORAL_CAPSULE | Freq: Two times a day (BID) | ORAL | 0 refills | Status: DC

## 2016-12-03 MED ORDER — PHENAZOPYRIDINE HCL 100 MG PO TABS: 100.0000 mg | ORAL_TABLET | Freq: Three times a day (TID) | ORAL | 0 refills | Status: DC

## 2016-12-03 MED ORDER — POTASSIUM CHLORIDE CRYS ER 20 MEQ PO TBCR
40.0000 meq | EXTENDED_RELEASE_TABLET | Freq: Once | ORAL | Status: AC
  Administered 2016-12-03: 09:00:00 40 meq via ORAL
  Filled 2016-12-03: qty 2

## 2016-12-03 MED ORDER — TRAMADOL HCL 50 MG PO TABS: 50.0000 mg | ORAL_TABLET | Freq: Four times a day (QID) | ORAL | 0 refills | Status: DC | PRN

## 2016-12-03 NOTE — Discharge Summary (Signed)
[facility] - [place] at [facility]   PATIENT NAME: Megan Floyd    MR#:  [id number]  DATE OF BIRTH:  March 20, 1991  DATE OF ADMISSION:  11/30/2016   ADMITTING PHYSICIAN: Tonye Royalty, DO  DATE OF DISCHARGE: 12/03/2016 11:12 AM  PRIMARY CARE PHYSICIAN: [facility]   ADMISSION DIAGNOSIS:   Pyelonephritis [N12] Sepsis, due to unspecified organism (HCC) [A41.9]  DISCHARGE DIAGNOSIS:   Active Problems:   Sepsis secondary to UTI (HCC)   SECONDARY DIAGNOSIS:   Past Medical History:  Diagnosis Date  . ADHD     HOSPITAL COURSE:   26 year old female with past medical history significant for ADHD, prior history of UTIs presents to hospital secondary to fevers and chills.  #1 sepsis-secondary to acute pyelonephritis on the right. -Urine cultures Growing mixed organisms and blood cultures are negative. -She was on Rocephin in the hospital which was changed to meropenem due to ongoing fevers and elevated white count. -CT of the abdomen revealed acute right-sided pyelonephritis, perinephric stranding was absent at the time. Patient has become afebrile and her white count has normalized. She is being discharged on cefdinir. -Pro-calcitonin level on admission was elevated and later was improving. -Clinically she has improved quite a bit  #2 hypokalemia-replaced  #3 ADHD-continue Adderall  Feeling much better today. Will be discharged home today.   DISCHARGE CONDITIONS:   Stable  CONSULTS OBTAINED:   None  DRUG ALLERGIES:   No Known Allergies DISCHARGE MEDICATIONS:   Allergies as of 12/03/2016   No Known Allergies     Medication List    TAKE these medications   amphetamine-dextroamphetamine 30 MG tablet Commonly known as:  ADDERALL Take 1 tablet by mouth 2 (two) times daily.   cefdinir 300 MG capsule Commonly known as:  OMNICEF Take 1 capsule (300 mg total) by mouth 2 (two) times daily. X 6 more days   JUNEL 1/20  1-20 MG-MCG tablet Generic drug:  norethindrone-ethinyl estradiol Take 1 tablet by mouth daily.   phenazopyridine 100 MG tablet Commonly known as:  PYRIDIUM Take 1 tablet (100 mg total) by mouth 3 (three) times daily with meals.   traMADol 50 MG tablet Commonly known as:  ULTRAM Take 1 tablet (50 mg total) by mouth every 6 (six) hours as needed.        DISCHARGE INSTRUCTIONS:   1. PCP f/u in 1-2 weeks  DIET:   Regular diet  ACTIVITY:   Activity as tolerated  OXYGEN:   Home Oxygen: No.  Oxygen Delivery: room air  DISCHARGE LOCATION:   home   If you experience worsening of your admission symptoms, develop shortness of breath, life threatening emergency, suicidal or homicidal thoughts you must seek medical attention immediately by calling 911 or calling your MD immediately  if symptoms less severe.  You Must read complete instructions/literature along with all the possible adverse reactions/side effects for all the Medicines you take and that have been prescribed to you. Take any new Medicines after you have completely understood and accpet all the possible adverse reactions/side effects.   Please note  You were cared for by a hospitalist during your hospital stay. If you have any questions about your discharge medications or the care you received while you were in the hospital after you are discharged, you can call the unit and asked to speak with the hospitalist on call if the hospitalist that took care of you is not available. Once you are discharged, your primary care physician will  handle any further medical issues. Please note that NO REFILLS for any discharge medications will be authorized once you are discharged, as it is imperative that you return to your primary care physician (or establish a relationship with a primary care physician if you do not have one) for your aftercare needs so that they can reassess your need for medications and monitor your lab  values.    On the day of Discharge:  VITAL SIGNS:   Blood pressure (!) 101/52, pulse 76, temperature 98.1 F (36.7 C), temperature source Oral, resp. rate 20, height  (1.651 m), weight 63.5 kg (140 lb), last menstrual period 10/02/2016, SpO2 99 %.  PHYSICAL EXAMINATION:    GENERAL:  26 y.o.-year-old patient lying in the bed with no acute distress.  EYES: Pupils equal, round, reactive to light and accommodation. No scleral icterus. Extraocular muscles intact.  HEENT: Head atraumatic, normocephalic. Oropharynx and nasopharynx clear.  NECK:  Supple, no jugular venous distention. No thyroid enlargement, no tenderness.  LUNGS: Normal breath sounds bilaterally, no wheezing, rales,rhonchi or crepitation. No use of accessory muscles of respiration.  CARDIOVASCULAR: S1, S2 normal. No murmurs, rubs, or gallops.  ABDOMEN: Soft, minimal right flank pain today, otherwise nontender, nondistended. Bowel sounds present. No organomegaly or mass.  EXTREMITIES: No pedal edema, cyanosis, or clubbing.  NEUROLOGIC: Cranial nerves II through XII are intact. Muscle strength 5/5 in all extremities. Sensation intact. Gait not checked.  PSYCHIATRIC: The patient is alert and oriented x 3.  SKIN: No obvious rash, lesion, or ulcer.   DATA REVIEW:   CBC  Recent Labs Lab 12/03/16 0438  WBC 9.4  HGB 10.4*  HCT 31.1*  PLT 300    Chemistries   Recent Labs Lab 12/01/16 0242  12/03/16 0438  NA 138  < > 139  K 3.3*  < > 3.3*  CL 110  < > 111  CO2 23  < > 24  GLUCOSE 124*  < > 113*  BUN 7  < > 9  CREATININE 0.85  < > 0.63  CALCIUM 7.4*  < > 8.2*  AST 25  --   --   ALT 17  --   --   ALKPHOS 42  --   --   BILITOT 0.6  --   --   < > = values in this interval not displayed.   Microbiology Results  Results for orders placed or performed during the hospital encounter of 11/30/16  Urine culture     Status: Abnormal   Collection Time: 11/30/16  8:43 PM  Result Value Ref Range Status   Specimen  Description URINE, RANDOM  Final   Special Requests NONE  Final   Culture MULTIPLE SPECIES PRESENT, SUGGEST RECOLLECTION (A)  Final   Report Status 12/02/2016 FINAL  Final  Culture, blood (Routine x 2)     Status: None (Preliminary result)   Collection Time: 11/30/16  9:03 PM  Result Value Ref Range Status   Specimen Description BLOOD RIGHT HAND  Final   Special Requests Blood Culture adequate volume  Final   Culture NO GROWTH 3 DAYS  Final   Report Status PENDING  Incomplete  Culture, blood (Routine x 2)     Status: None (Preliminary result)   Collection Time: 11/30/16  9:10 PM  Result Value Ref Range Status   Specimen Description BLOOD LT HAND  Final   Special Requests   Final    Blood Culture results may not be optimal due to an excessive  volume of blood received in culture bottles   Culture NO GROWTH 3 DAYS  Final   Report Status PENDING  Incomplete    RADIOLOGY:  Ct Abdomen Pelvis W Contrast  Result Date: 12/02/2016 CLINICAL DATA:  Acute onset of fever and chills. Known right-sided pyelonephritis. Leukocytosis and right flank pain. Initial encounter. EXAM: CT ABDOMEN AND PELVIS WITH CONTRAST TECHNIQUE: Multidetector CT imaging of the abdomen and pelvis was performed using the standard protocol following bolus administration of intravenous contrast. CONTRAST:  ISOVUE-300 IOPAMIDOL (ISOVUE-300) INJECTION 61% COMPARISON:  Renal ultrasound performed 11/30/2016, and CT of the abdomen and pelvis performed 02/21/2011 FINDINGS: Lower chest: Minimal patchy bibasilar atelectasis is noted. The visualized portions of the mediastinum are unremarkable. Hepatobiliary: The liver is unremarkable in appearance. The gallbladder is unremarkable in appearance. The common bile duct remains normal in caliber. Pancreas: The pancreas is within normal limits. Spleen: The spleen is unremarkable in appearance. Adrenals/Urinary Tract: The adrenal glands are unremarkable in appearance. The kidneys are within  normal limits Focally decreased enhancement is noted at the medial aspect of the right kidney, with surrounding soft tissue inflammation, concerning for acute pyelonephritis. There is no evidence of hydronephrosis. No renal or ureteral stones are identified. No perinephric stranding is seen. Stomach/Bowel: The stomach is unremarkable in appearance. The small bowel is within normal limits. The appendix is normal in caliber, without evidence of appendicitis. The colon is unremarkable in appearance. Vascular/Lymphatic: The abdominal aorta is unremarkable in appearance. The inferior vena cava is grossly unremarkable. No retroperitoneal lymphadenopathy is seen. No pelvic sidewall lymphadenopathy is identified. Reproductive: The bladder is mildly distended and within normal limits. The uterus is grossly unremarkable in appearance. The ovaries are relatively symmetric. No suspicious adnexal masses are seen. Other: No additional soft tissue abnormalities are seen. Musculoskeletal: No acute osseous abnormalities are identified. The visualized musculature is unremarkable in appearance. IMPRESSION: Acute pyelonephritis at the medial aspect of the right kidney. Electronically Signed   By: Roanna Raider M.D.   On: 12/02/2016 18:54     Management plans discussed with the patient, family and they are in agreement.  CODE STATUS:     Code Status Orders        Start     Ordered   12/01/16 0226  Full code  Continuous     12/01/16 0225    Code Status History    Date Active Date Inactive Code Status Order ID Comments User Context   This patient has a current code status but no historical code status.      TOTAL TIME TAKING CARE OF THIS PATIENT: 38 minutes.    Enid Baas M.D on 12/03/2016 at 12:06 PM  Between 7am to 6pm - Pager - [phone]  After 6pm go to [email or web address] - Social research officer, government  [employer]  Hospitalists  Office  [phone]  CC: Primary care physician; [facility]   Note: This dictation was prepared with Dragon dictation along with smaller phrase technology. Any transcriptional errors that result from this process are unintentional.

## 2016-12-03 NOTE — Progress Notes (Signed)
     Megan Floyd was admitted to the [facility] on 11/30/2016 for an acute medical condition and is being Discharged on  12/03/2016 . She is still on treatment and will need another 3 days for recovery and so advised to stay away from work until then. So please excuse her from work for the above  Days. Should be able to return to work without any restrictions from 12/07/16 .  Call Enid Baas  MD, [facility] at  [phone] with questions.  Enid Baas M.D on 12/03/2016,at 8:31 AM  [facility] [address] [place] [address]

## 2016-12-03 NOTE — Progress Notes (Signed)
Discharge instructions given and went over with patient and significant other at bedside. Prescriptions given and reviewed as well as coupons provided by case management x2. Follow-up appointment reviewed. All questions answered. Patient verbalized understanding. Patient discharged home. Bo Mcclintock, RN

## 2016-12-05 LAB — CULTURE, BLOOD (ROUTINE X 2)
CULTURE: NO GROWTH
Culture: NO GROWTH
Special Requests: ADEQUATE

## 2017-06-25 ENCOUNTER — Emergency Department
Admission: EM | Admit: 2017-06-25 | Discharge: 2017-06-25 | Disposition: A | Discharge status: Home / Self Care | Payer: Self-pay | Attending: Student in an Organized Health Care Education/Training Program | Admitting: Student in an Organized Health Care Education/Training Program

## 2017-06-25 ENCOUNTER — Encounter: Payer: Self-pay | Admitting: Emergency Medicine

## 2017-06-25 ENCOUNTER — Emergency Department: Payer: Self-pay

## 2017-06-25 DIAGNOSIS — Y999 Unspecified external cause status: Secondary | ICD-10-CM | POA: Insufficient documentation

## 2017-06-25 DIAGNOSIS — Y939 Activity, unspecified: Secondary | ICD-10-CM | POA: Insufficient documentation

## 2017-06-25 DIAGNOSIS — W010XXA Fall on same level from slipping, tripping and stumbling without subsequent striking against object, initial encounter: Secondary | ICD-10-CM | POA: Insufficient documentation

## 2017-06-25 DIAGNOSIS — Z79899 Other long term (current) drug therapy: Secondary | ICD-10-CM | POA: Insufficient documentation

## 2017-06-25 DIAGNOSIS — Y92481 Parking lot as the place of occurrence of the external cause: Secondary | ICD-10-CM | POA: Insufficient documentation

## 2017-06-25 DIAGNOSIS — M25571 Pain in right ankle and joints of right foot: Secondary | ICD-10-CM

## 2017-06-25 MED ORDER — NAPROXEN 500 MG PO TABS: 500.0000 mg | ORAL_TABLET | Freq: Two times a day (BID) | ORAL | 0 refills | Status: AC

## 2017-06-25 MED ORDER — OXYCODONE-ACETAMINOPHEN 5-325 MG PO TABS
1.0000 | ORAL_TABLET | Freq: Once | ORAL | Status: AC
  Administered 2017-06-25: 1 via ORAL
  Filled 2017-06-25: qty 1

## 2017-06-25 NOTE — ED Provider Notes (Signed)
[facility] Emergency Department Provider Note    First MD Initiated Contact with Patient 06/25/17 0421     (approximate)  I have reviewed the triage vital signs and the nursing notes.   HISTORY  Chief Complaint Ankle Pain    HPI Megan Floyd is a 26 y.o. female no recent hospitalizations presents with chief complaint of moderate to severe right ankle pain occurred after the patient was walking and tripped in a parking lot rolled her ankle.  She was wearing high heels.  Denies any other injury.  Denies any numbness or tingling but has had severe swelling and has been unable to ambulate due to the pain.  Past Medical History:  Diagnosis Date  . ADHD    No family history on file. Past Surgical History:  Procedure Laterality Date  . TONSILLECTOMY     Patient Active Problem List   Diagnosis Date Noted  . Sepsis secondary to UTI (HCC) 12/01/2016      Prior to Admission medications   Medication Sig Start Date End Date Taking? Authorizing Provider  amphetamine-dextroamphetamine (ADDERALL) 30 MG tablet Take 1 tablet by mouth 2 (two) times daily. 11/24/16   [provider]  cefdinir (OMNICEF) 300 MG capsule Take 1 capsule (300 mg total) by mouth 2 (two) times daily. X 6 more days 12/03/16   Enid Baas, MD  [facility] 1/20 1-20 MG-MCG tablet Take 1 tablet by mouth daily. 11/04/16   [provider]  naproxen (NAPROSYN) 500 MG tablet Take 1 tablet (500 mg total) by mouth 2 (two) times daily with a meal. 06/25/17 06/25/18  Willy Eddy, MD  phenazopyridine (PYRIDIUM) 100 MG tablet Take 1 tablet (100 mg total) by mouth 3 (three) times daily with meals. 12/03/16   Enid Baas, MD  traMADol (ULTRAM) 50 MG tablet Take 1 tablet (50 mg total) by mouth every 6 (six) hours as needed. 12/03/16   Enid Baas, MD    Allergies Patient has no known allergies.    Social History Social History  Substance Use Topics  . Smoking  status: Never Smoker  . Smokeless tobacco: Never Used  . Alcohol use Yes    Review of Systems Patient denies headaches, rhinorrhea, blurry vision, numbness, shortness of breath, chest pain, edema, cough, abdominal pain, nausea, vomiting, diarrhea, dysuria, fevers, rashes or hallucinations unless otherwise stated above in HPI. ____________________________________________   PHYSICAL EXAM:  VITAL SIGNS: Vitals:   06/25/17 0319 06/25/17 0320  BP:  107/60  Pulse: 99   Resp: 18   Temp: 98.2 F (36.8 C)   SpO2: 100%     Constitutional: Alert and oriented. Well appearing and in no acute distress. Eyes: Conjunctivae are normal.  Head: Atraumatic. Nose: No congestion/rhinnorhea. Mouth/Throat: Mucous membranes are moist.   Neck: Painless ROM.  Cardiovascular:   Good peripheral circulation. Respiratory: Normal respiratory effort.  No retractions.  Gastrointestinal: Soft and nontender.  Musculoskeletal: Obvious swelling and tenderness to palpation right lateral malleolus.  No obvious deformity.  No pain of the calcaneus.  Sensation intact distally.  Cap refill brisk.  No pain at the base of the fifth metatarsal or proximal fibula.  No other lower extremity tenderness .  No joint effusions. Neurologic:  Normal speech and language. No gross focal neurologic deficits are appreciated.  Skin:  Skin is warm, dry and intact. No rash noted. Psychiatric: Mood and affect are normal. Speech and behavior are normal.  ____________________________________________   LABS (all labs ordered are listed, but only abnormal  results are displayed)  No results found for this or any previous visit (from the past 24 hour(s)). ____________________________________________ ____________________________________________  RADIOLOGY  I personally reviewed all radiographic images ordered to evaluate for the above acute complaints and reviewed radiology reports and findings.  These findings were personally discussed  with the patient.  Please see medical record for radiology report.  ____________________________________________   PROCEDURES  Procedure(s) performed:  Procedures    Critical Care performed: no ____________________________________________   INITIAL IMPRESSION / ASSESSMENT AND PLAN / ED COURSE  Pertinent labs & imaging results that were available during my care of the patient were reviewed by me and considered in my medical decision making (see chart for details).  DDX: fracture, contusion, dislocation, sprain  Tammi SouDanielle J Mannina is a 26 y.o. who presents to the ED with 26 y.o. female with a acute right ankle injury. Patient is AFVSS in ED. Exam as above. Given current presentation have considered the above differential. Denies any other injuries. Denies motor or sensory loss. Able to bear weight. Afebrile and VSS in Ed. Exam as above. NV intact throughout and distal to injury. Pt able to range joint. No clinical suspicion for infectious process or septic joint. Treatments will include observation, X-rays.  X-rays w/o fracture. No other injuries reported or noted on exam. Presentation c/w sprain. Discussed supportive care and follow up with pt.       ____________________________________________   FINAL CLINICAL IMPRESSION(S) / ED DIAGNOSES  Final diagnoses:  Acute right ankle pain      NEW MEDICATIONS STARTED DURING THIS VISIT:  New Prescriptions   NAPROXEN (NAPROSYN) 500 MG TABLET    Take 1 tablet (500 mg total) by mouth 2 (two) times daily with a meal.     Note:  This document was prepared using Dragon voice recognition software and may include unintentional dictation errors.     Willy Eddyobinson, Sherian Valenza, MD 06/25/17 [phone]

## 2017-06-25 NOTE — ED Triage Notes (Signed)
Patient states that she twisted her right ankle. Patient with pain and swelling to her right ankle.

## 2017-06-25 NOTE — ED Notes (Signed)
Visitor leaving saw pt in parking lot needing help; this nurse and officer on duty out to assist pt with wheelchair; pt c/o right ankle pain after "bending my foot in half" while wearing heels; marked swelling to area; unable to bear weight

## 2017-10-02 ENCOUNTER — Encounter: Payer: Self-pay | Admitting: Nurse Practitioner

## 2017-10-02 ENCOUNTER — Ambulatory Visit: Payer: Self-pay | Admitting: Nurse Practitioner

## 2017-10-02 VITALS — BP 125/66 | HR 89 | Resp 16 | Ht 66.0 in | Wt 147.4 lb

## 2017-10-02 DIAGNOSIS — R41840 Attention and concentration deficit: Secondary | ICD-10-CM

## 2017-10-02 MED ORDER — AMPHETAMINE-DEXTROAMPHETAMINE 30 MG PO TABS: 1.0000 | ORAL_TABLET | Freq: Two times a day (BID) | ORAL | 0 refills | Status: DC

## 2017-10-11 NOTE — Progress Notes (Addendum)
[facility] [address] [place], [place] [address]  Internal MEDICINE  Office Visit Note  Patient Name: Megan Floyd  [id number]  [id number]  Date of Service: 10/11/2017  Chief Complaint  Patient presents with  . Medication Refill    on adderall 30mg  BID when needed.     Medication Refill  This is a chronic problem. The current episode started more than 1 year ago. The problem has been unchanged. Pertinent negatives include no abdominal pain, arthralgias, chest pain, chills, congestion, coughing, fatigue, headaches, joint swelling, nausea, neck pain, numbness, rash, sore throat or vomiting. Nothing aggravates the symptoms. Treatments tried: oral stimulant medication. The treatment provided moderate relief.    Pt is here for routine follow up.    Current Medication: Outpatient Encounter Medications as of 10/02/2017  Medication Sig  . amphetamine-dextroamphetamine (ADDERALL) 30 MG tablet Take 1 tablet by mouth 2 (two) times daily.  Colleen Can 1/20 1-20 MG-MCG tablet Take 1 tablet by mouth daily.  . naproxen (NAPROSYN) 500 MG tablet Take 1 tablet (500 mg total) by mouth 2 (two) times daily with a meal. (Patient taking differently: Take 500 mg by mouth as needed. )  . [DISCONTINUED] amphetamine-dextroamphetamine (ADDERALL) 30 MG tablet Take 1 tablet by mouth 2 (two) times daily.  . [DISCONTINUED] amphetamine-dextroamphetamine (ADDERALL) 30 MG tablet Take 1 tablet by mouth 2 (two) times daily.  . [DISCONTINUED] amphetamine-dextroamphetamine (ADDERALL) 30 MG tablet Take 1 tablet by mouth 2 (two) times daily.  . cefdinir (OMNICEF) 300 MG capsule Take 1 capsule (300 mg total) by mouth 2 (two) times daily. X 6 more days (Patient not taking: Reported on 10/02/2017)  . phenazopyridine (PYRIDIUM) 100 MG tablet Take 1 tablet (100 mg total) by mouth 3 (three) times daily with meals. (Patient not taking: Reported on 10/02/2017)  . traMADol (ULTRAM) 50 MG tablet Take 1 tablet (50 mg  total) by mouth every 6 (six) hours as needed.   No facility-administered encounter medications on file as of 10/02/2017.     Surgical History: Past Surgical History:  Procedure Laterality Date  . TONSILLECTOMY      Medical History: Past Medical History:  Diagnosis Date  . ADHD   . Anxiety   . Depression     Family History: No family history on file.  Social History   Socioeconomic History  . Marital status: Single    Spouse name: Not on file  . Number of children: Not on file  . Years of education: Not on file  . Highest education level: Not on file  Social Needs  . Financial resource strain: Not on file  . Food insecurity - worry: Not on file  . Food insecurity - inability: Not on file  . Transportation needs - medical: Not on file  . Transportation needs - non-medical: Not on file  Occupational History  . Not on file  Tobacco Use  . Smoking status: Never Smoker  . Smokeless tobacco: Never Used  Substance and Sexual Activity  . Alcohol use: Yes    Comment: ocassionally  . Drug use: No  . Sexual activity: Not on file  Other Topics Concern  . Not on file  Social History Narrative  . Not on file      Review of Systems  Constitutional: Negative for activity change, chills, fatigue and unexpected weight change.  HENT: Negative for congestion, postnasal drip, rhinorrhea, sneezing and sore throat.   Eyes: Negative.  Negative for redness.  Respiratory: Negative for cough, chest tightness,  shortness of breath and wheezing.   Cardiovascular: Negative for chest pain and palpitations.  Gastrointestinal: Negative for abdominal pain, constipation, diarrhea, nausea and vomiting.  Endocrine: Negative for cold intolerance, heat intolerance, polydipsia, polyphagia and polyuria.  Genitourinary: Negative for dysuria and frequency.  Musculoskeletal: Negative for arthralgias, back pain, joint swelling and neck pain.  Skin: Negative for rash.  Allergic/Immunologic:  Negative.   Neurological: Negative for tremors, numbness and headaches.  Hematological: Negative for adenopathy. Does not bruise/bleed easily.  Psychiatric/Behavioral: Positive for decreased concentration. Negative for behavioral problems (Depression), sleep disturbance and suicidal ideas. The patient is nervous/anxious.     Vital Signs: BP 125/66   Pulse 89   Resp 16   Ht 5\' 6"  (1.676 m)   Wt 147 lb 6.4 oz (66.9 kg)   SpO2 100%   BMI 23.79 kg/m    Physical Exam  Constitutional: She is oriented to person, place, and time. She appears well-developed and well-nourished.  HENT:  Head: Normocephalic and atraumatic.  Eyes: Pupils are equal, round, and reactive to light.  Neck: Normal range of motion. Neck supple. No thyromegaly present.  Cardiovascular: Normal rate, regular rhythm and normal heart sounds.  Pulmonary/Chest: Effort normal and breath sounds normal. She has no wheezes.  Abdominal: Soft. Bowel sounds are normal. There is no tenderness.  Musculoskeletal: Normal range of motion.  Lymphadenopathy:    She has no cervical adenopathy.  Neurological: She is alert and oriented to person, place, and time.  Skin: Skin is warm and dry.  Psychiatric: She has a normal mood and affect. Her behavior is normal. Judgment and thought content normal.  Nursing note and vitals reviewed.  Assessment/Plan:  1. Attention and concentration deficit - amphetamine-dextroamphetamine (ADDERALL) 30 MG tablet; Take 1 tablet by mouth 2 (two) times daily.  Dispense: 60 tablet; Refill: 0  Three 30 day prescriptions provided today. Dates are 10/02/2017, 10/31/2017, and 11/28/2017  General Counseling: Carley Hammedanielle verbalizes understanding of the findings of todays visit and agrees with plan of treatment. I have discussed any further diagnostic evaluation that may be needed or ordered today. We also reviewed her medications today. she has been encouraged to call the office with any questions or concerns that  should arise related to todays visit.  This patient was seen by Vincent GrosHeather Varian Innes, FNP- C in Collaboration with Dr Lyndon CodeFozia M Khan as a part of collaborative care agreement    Meds ordered this encounter  Medications  . DISCONTD: amphetamine-dextroamphetamine (ADDERALL) 30 MG tablet    Sig: Take 1 tablet by mouth 2 (two) times daily.    Dispense:  60 tablet    Refill:  0    Order Specific Question:   Supervising Provider    Answer:   Lyndon CodeKHAN, FOZIA M [[id number]]  . DISCONTD: amphetamine-dextroamphetamine (ADDERALL) 30 MG tablet    Sig: Take 1 tablet by mouth 2 (two) times daily.    Dispense:  60 tablet    Refill:  0    Fill after 10/31/2017    Order Specific Question:   Supervising Provider    Answer:   Lyndon CodeKHAN, FOZIA M [[id number]]  . amphetamine-dextroamphetamine (ADDERALL) 30 MG tablet    Sig: Take 1 tablet by mouth 2 (two) times daily.    Dispense:  60 tablet    Refill:  0    Fill after 11/28/2017    Order Specific Question:   Supervising Provider    Answer:   Lyndon CodeKHAN, FOZIA M [[id number]]    Time spent: 15 Minutes  Dr Lavera Guise Internal medicine

## 2018-01-25 ENCOUNTER — Other Ambulatory Visit: Payer: Self-pay | Admitting: Nurse Practitioner

## 2018-01-25 ENCOUNTER — Telehealth: Payer: Self-pay | Admitting: Nurse Practitioner

## 2018-01-25 DIAGNOSIS — R41840 Attention and concentration deficit: Secondary | ICD-10-CM

## 2018-01-25 MED ORDER — AMPHETAMINE-DEXTROAMPHETAMINE 30 MG PO TABS: 1.0000 | ORAL_TABLET | Freq: Two times a day (BID) | ORAL | 0 refills | Status: DC

## 2018-01-25 NOTE — Telephone Encounter (Signed)
I approved and filled this for 30 days. Sent new rx to [facility] [address]. She will need to be seen for further refills.

## 2018-01-25 NOTE — Progress Notes (Signed)
I approved and filled this for 30 days. Sent new rx to [facility] [address]. She will need to be seen for further refills.

## 2018-01-26 NOTE — Telephone Encounter (Signed)
Pt informed refill for adderall was refilled for 30 days and pt need to be seen for further refills per FNP Vincent Gros

## 2018-03-13 ENCOUNTER — Ambulatory Visit: Payer: Self-pay | Admitting: Nurse Practitioner

## 2018-04-02 ENCOUNTER — Telehealth: Payer: Self-pay | Admitting: Internal Medicine

## 2018-04-02 NOTE — Telephone Encounter (Signed)
PATIENT CALLED AND STATES THAT SHE DOES NOT HAVE MONEY FOR AN APPOINTMENT AND WOULD LIKE TO KNOW IF SHE CAN HAVE A REFILL ON HER ADDERALL WITHOUT BEING SEEN

## 2018-04-02 NOTE — Telephone Encounter (Signed)
Pt called today foe adderall refills as per dr Welton Flakeskhan pt need appt for refills I advised pt

## 2018-04-12 ENCOUNTER — Encounter: Payer: Self-pay | Admitting: Nurse Practitioner

## 2018-04-12 ENCOUNTER — Ambulatory Visit: Payer: Self-pay | Admitting: Nurse Practitioner

## 2018-04-12 VITALS — BP 107/63 | HR 102 | Resp 16 | Ht 66.0 in | Wt 152.0 lb

## 2018-04-12 DIAGNOSIS — R5383 Other fatigue: Secondary | ICD-10-CM

## 2018-04-12 DIAGNOSIS — R41840 Attention and concentration deficit: Secondary | ICD-10-CM

## 2018-04-12 DIAGNOSIS — Z79899 Other long term (current) drug therapy: Secondary | ICD-10-CM

## 2018-04-12 DIAGNOSIS — F411 Generalized anxiety disorder: Secondary | ICD-10-CM

## 2018-04-12 LAB — POCT URINE DRUG SCREEN
POC AMPHETAMINE UR: NOT DETECTED
POC BENZODIAZEPINES UR: NOT DETECTED
POC Barbiturate UR: NOT DETECTED
POC COCAINE UR: NOT DETECTED
POC ECSTASY UR: NOT DETECTED
POC Marijuana UR: NOT DETECTED
POC Methadone UR: NOT DETECTED
POC Methamphetamine UR: NOT DETECTED
POC Opiate Ur: NOT DETECTED
POC Oxycodone UR: NOT DETECTED
POC PHENCYCLIDINE UR: NOT DETECTED
POC TRICYCLICS UR: NOT DETECTED

## 2018-04-12 MED ORDER — AMPHETAMINE-DEXTROAMPHETAMINE 30 MG PO TABS: 1.0000 | ORAL_TABLET | Freq: Two times a day (BID) | ORAL | 0 refills | Status: DC

## 2018-04-12 MED ORDER — BUSPIRONE HCL 10 MG PO TABS: 10.0000 mg | ORAL_TABLET | Freq: Two times a day (BID) | ORAL | 3 refills | Status: DC

## 2018-04-12 NOTE — Progress Notes (Deleted)
  [facility] [address] [place], [place] [address]  Internal MEDICINE  Office Visit Note  Patient Name: Megan Floyd  1992  [id number]  Date of Service: 04/12/2018  No chief complaint on file.    HPI Pt is here for routine health maintenance examination  Current Medication: Outpatient Encounter Medications as of 04/12/2018  Medication Sig  . amphetamine-dextroamphetamine (ADDERALL) 30 MG tablet Take 1 tablet by mouth 2 (two) times daily.  . cefdinir (OMNICEF) 300 MG capsule Take 1 capsule (300 mg total) by mouth 2 (two) times daily. X 6 more days  . JUNEL 1/20 1-20 MG-MCG tablet Take 1 tablet by mouth daily.  . naproxen (NAPROSYN) 500 MG tablet Take 1 tablet (500 mg total) by mouth 2 (two) times daily with a meal. (Patient taking differently: Take 500 mg by mouth as needed. )  . phenazopyridine (PYRIDIUM) 100 MG tablet Take 1 tablet (100 mg total) by mouth 3 (three) times daily with meals.  . traMADol (ULTRAM) 50 MG tablet Take 1 tablet (50 mg total) by mouth every 6 (six) hours as needed.   No facility-administered encounter medications on file as of 04/12/2018.     Surgical History: Past Surgical History:  Procedure Laterality Date  . TONSILLECTOMY      Medical History: Past Medical History:  Diagnosis Date  . ADHD   . Anxiety   . Depression     Family History: No family history on file.    Review of Systems   Vital Signs: There were no vitals taken for this visit.   Physical Exam   LABS: No results found for this or any previous visit (from the past 2160 hour(s)).  Marland Kitchen.MAMM    Assessment/Plan:   General Counseling: Megan Floyd verbalizes understanding of the findings of todays visit and agrees with plan of treatment. I have discussed any further diagnostic evaluation that may be needed or ordered today. We also reviewed her medications today. she has been encouraged to call the office with any questions or concerns that should  arise related to todays visit.    Counseling:    No orders of the defined types were placed in this encounter.   No orders of the defined types were placed in this encounter.   Time spent:*** Minutes      Lyndon CodeFozia M Khan, MD  Internal Medicine

## 2018-04-12 NOTE — Progress Notes (Signed)
[facility] [address] [place], [place] [address]  Internal MEDICINE  Office Visit Note  Patient Name: Megan Floyd  [id number]  [id number]  Date of Service: 04/29/2018  Chief Complaint  Patient presents with  . ADHD    medication follow up  . Anxiety    The patient currently takes adderall 30mg  twice daily to help with focus and concentration. Increased anxiety due to custody issues and work stress.       Current Medication: Outpatient Encounter Medications as of 04/12/2018  Medication Sig  . amphetamine-dextroamphetamine (ADDERALL) 30 MG tablet Take 1 tablet by mouth 2 (two) times daily.  Colleen Can 1/20 1-20 MG-MCG tablet Take 1 tablet by mouth daily.  . naproxen (NAPROSYN) 500 MG tablet Take 1 tablet (500 mg total) by mouth 2 (two) times daily with a meal. (Patient taking differently: Take 500 mg by mouth as needed. )  . traMADol (ULTRAM) 50 MG tablet Take 1 tablet (50 mg total) by mouth every 6 (six) hours as needed.  . [DISCONTINUED] amphetamine-dextroamphetamine (ADDERALL) 30 MG tablet Take 1 tablet by mouth 2 (two) times daily.  . [DISCONTINUED] amphetamine-dextroamphetamine (ADDERALL) 30 MG tablet Take 1 tablet by mouth 2 (two) times daily.  . [DISCONTINUED] amphetamine-dextroamphetamine (ADDERALL) 30 MG tablet Take 1 tablet by mouth 2 (two) times daily.  . [DISCONTINUED] amphetamine-dextroamphetamine (ADDERALL) 30 MG tablet Take 1 tablet by mouth 2 (two) times daily.  . [DISCONTINUED] amphetamine-dextroamphetamine (ADDERALL) 30 MG tablet Take 1 tablet by mouth 2 (two) times daily.  . [DISCONTINUED] amphetamine-dextroamphetamine (ADDERALL) 30 MG tablet Take 1 tablet by mouth 2 (two) times daily.  . [DISCONTINUED] amphetamine-dextroamphetamine (ADDERALL) 30 MG tablet Take 1 tablet by mouth 2 (two) times daily.  . [DISCONTINUED] amphetamine-dextroamphetamine (ADDERALL) 30 MG tablet Take 1 tablet by mouth 2 (two) times daily.  . [DISCONTINUED]  amphetamine-dextroamphetamine (ADDERALL) 30 MG tablet Take 1 tablet by mouth 2 (two) times daily.  . busPIRone (BUSPAR) 10 MG tablet Take 1 tablet (10 mg total) by mouth 2 (two) times daily.  . [DISCONTINUED] cefdinir (OMNICEF) 300 MG capsule Take 1 capsule (300 mg total) by mouth 2 (two) times daily. X 6 more days (Patient not taking: Reported on 04/12/2018)  . [DISCONTINUED] phenazopyridine (PYRIDIUM) 100 MG tablet Take 1 tablet (100 mg total) by mouth 3 (three) times daily with meals. (Patient not taking: Reported on 04/12/2018)   No facility-administered encounter medications on file as of 04/12/2018.     Surgical History: Past Surgical History:  Procedure Laterality Date  . TONSILLECTOMY      Medical History: Past Medical History:  Diagnosis Date  . ADHD   . Anxiety   . Depression     Family History: History reviewed. No pertinent family history.  Social History   Socioeconomic History  . Marital status: Single    Spouse name: Not on file  . Number of children: Not on file  . Years of education: Not on file  . Highest education level: Not on file  Occupational History  . Not on file  Social Needs  . Financial resource strain: Not on file  . Food insecurity:    Worry: Not on file    Inability: Not on file  . Transportation needs:    Medical: Not on file    Non-medical: Not on file  Tobacco Use  . Smoking status: Never Smoker  . Smokeless tobacco: Never Used  Substance and Sexual Activity  . Alcohol use: Yes    Comment: ocassionally  .  Drug use: No  . Sexual activity: Not on file  Lifestyle  . Physical activity:    Days per week: Not on file    Minutes per session: Not on file  . Stress: Not on file  Relationships  . Social connections:    Talks on phone: Not on file    Gets together: Not on file    Attends religious service: Not on file    Active member of club or organization: Not on file    Attends meetings of clubs or organizations: Not on file     Relationship status: Not on file  . Intimate partner violence:    Fear of current or ex partner: Not on file    Emotionally abused: Not on file    Physically abused: Not on file    Forced sexual activity: Not on file  Other Topics Concern  . Not on file  Social History Narrative  . Not on file      Review of Systems  Constitutional: Negative for activity change, chills, fatigue and unexpected weight change.  HENT: Negative for congestion, postnasal drip, rhinorrhea, sneezing and sore throat.   Eyes: Negative.  Negative for redness.  Respiratory: Negative for cough, chest tightness, shortness of breath and wheezing.   Cardiovascular: Negative for chest pain and palpitations.  Gastrointestinal: Negative for abdominal pain, constipation, diarrhea, nausea and vomiting.  Endocrine: Negative for cold intolerance, heat intolerance, polydipsia, polyphagia and polyuria.  Genitourinary: Negative.  Negative for dysuria and frequency.  Musculoskeletal: Negative for arthralgias, back pain, joint swelling and neck pain.  Skin: Negative for rash.  Allergic/Immunologic: Negative.   Neurological: Negative for dizziness, tremors, numbness and headaches.  Hematological: Negative for adenopathy. Does not bruise/bleed easily.  Psychiatric/Behavioral: Positive for decreased concentration. Negative for behavioral problems (Depression), sleep disturbance and suicidal ideas. The patient is nervous/anxious.     Today's Vitals   04/12/18 1501  BP: 107/63  Pulse: (!) 102  Resp: 16  SpO2: 99%  Weight: 152 lb (68.9 kg)  Height: 5\' 6"  (1.676 m)   Physical Exam  Constitutional: She is oriented to person, place, and time. She appears well-developed and well-nourished.  HENT:  Head: Normocephalic and atraumatic.  Nose: Nose normal.  Mouth/Throat: Oropharynx is clear and moist.  Eyes: Pupils are equal, round, and reactive to light. Conjunctivae are normal.  Neck: Normal range of motion. Neck supple. No  JVD present. No tracheal deviation present. No thyromegaly present.  Cardiovascular: Normal rate, regular rhythm and normal heart sounds.  Pulmonary/Chest: Effort normal and breath sounds normal. She has no wheezes.  Abdominal: Soft. Bowel sounds are normal. There is no tenderness.  Musculoskeletal: Normal range of motion.  Lymphadenopathy:    She has no cervical adenopathy.  Neurological: She is alert and oriented to person, place, and time.  Skin: Skin is warm and dry. Capillary refill takes less than 2 seconds.  Psychiatric: Her speech is normal and behavior is normal. Judgment and thought content normal. Her mood appears anxious. Cognition and memory are normal.  Nursing note and vitals reviewed.  Assessment/Plan: 1. Other fatigue Likely due to increased stress and anxiety. Treat as below and monitor closely.  2. Attention and concentration deficit Continue adderall 3.mg twice daily as needed for focus and concentration issues. Three 30 day prescriptions were sent to her pharmacy. Dates are 04/12/2018, 05/11/2018, and 06/08/2018. - amphetamine-dextroamphetamine (ADDERALL) 30 MG tablet; Take 1 tablet by mouth 2 (two) times daily.  Dispense: 60 tablet; Refill: 0  3.  Generalized anxiety disorder Added new prescription for buspirone 10mg  twice daily as needed.  - busPIRone (BUSPAR) 10 MG tablet; Take 1 tablet (10 mg total) by mouth 2 (two) times daily.  Dispense: 60 tablet; Refill: 3  4. Encounter for long-term (current) use of medications - POCT Urine Drug Screen negative for all controlled substances.   General Counseling: Megan Floyd verbalizes understanding of the findings of todays visit and agrees with plan of treatment. I have discussed any further diagnostic evaluation that may be needed or ordered today. We also reviewed her medications today. she has been encouraged to call the office with any questions or concerns that should arise related to todays visit.  This patient was seen by  Vincent Gros FNP Collaboration with Megan Lyndon Code as a part of collaborative care agreement  Orders Placed This Encounter  Procedures  . POCT Urine Drug Screen    Meds ordered this encounter  Medications  . DISCONTD: amphetamine-dextroamphetamine (ADDERALL) 30 MG tablet    Sig: Take 1 tablet by mouth 2 (two) times daily.    Dispense:  60 tablet    Refill:  0    Order Specific Question:   Supervising Provider    Answer:   Lyndon Code [[id number]]  . DISCONTD: amphetamine-dextroamphetamine (ADDERALL) 30 MG tablet    Sig: Take 1 tablet by mouth 2 (two) times daily.    Dispense:  60 tablet    Refill:  0    To fill after 05/11/2018    Order Specific Question:   Supervising Provider    Answer:   Lyndon Code [[id number]]  . DISCONTD: amphetamine-dextroamphetamine (ADDERALL) 30 MG tablet    Sig: Take 1 tablet by mouth 2 (two) times daily.    Dispense:  60 tablet    Refill:  0    To fill after 05/11/2018    Order Specific Question:   Supervising Provider    Answer:   Lyndon Code [[id number]]  . busPIRone (BUSPAR) 10 MG tablet    Sig: Take 1 tablet (10 mg total) by mouth 2 (two) times daily.    Dispense:  60 tablet    Refill:  3    Order Specific Question:   Supervising Provider    Answer:   Lyndon Code [[id number]]  . DISCONTD: amphetamine-dextroamphetamine (ADDERALL) 30 MG tablet    Sig: Take 1 tablet by mouth 2 (two) times daily.    Dispense:  60 tablet    Refill:  0    Order Specific Question:   Supervising Provider    Answer:   Lyndon Code [[id number]]  . DISCONTD: amphetamine-dextroamphetamine (ADDERALL) 30 MG tablet    Sig: Take 1 tablet by mouth 2 (two) times daily.    Dispense:  60 tablet    Refill:  0    Order Specific Question:   Supervising Provider    Answer:   Lyndon Code [[id number]]  . DISCONTD: amphetamine-dextroamphetamine (ADDERALL) 30 MG tablet    Sig: Take 1 tablet by mouth 2 (two) times daily.    Dispense:  60 tablet    Refill:  0    Order Specific Question:   Supervising  Provider    Answer:   Lyndon Code [[id number]]  . DISCONTD: amphetamine-dextroamphetamine (ADDERALL) 30 MG tablet    Sig: Take 1 tablet by mouth 2 (two) times daily.    Dispense:  60 tablet    Refill:  0    Order Specific Question:   Supervising Provider  Answer:   Lyndon CodeKHAN, FOZIA M [[id number]]  . DISCONTD: amphetamine-dextroamphetamine (ADDERALL) 30 MG tablet    Sig: Take 1 tablet by mouth 2 (two) times daily.    Dispense:  60 tablet    Refill:  0    Fill after 05/12/2018    Order Specific Question:   Supervising Provider    Answer:   Lyndon CodeKHAN, FOZIA M [[id number]]  . amphetamine-dextroamphetamine (ADDERALL) 30 MG tablet    Sig: Take 1 tablet by mouth 2 (two) times daily.    Dispense:  60 tablet    Refill:  0    Fill after 06/09/2018    Order Specific Question:   Supervising Provider    Answer:   Lyndon CodeKHAN, FOZIA M [[id number]]    Time spent: 3315 Minutes      Megan Floyd Internal medicine

## 2018-04-13 MED ORDER — AMPHETAMINE-DEXTROAMPHETAMINE 30 MG PO TABS: 1.0000 | ORAL_TABLET | Freq: Two times a day (BID) | ORAL | 0 refills | Status: DC

## 2018-04-29 DIAGNOSIS — R5383 Other fatigue: Secondary | ICD-10-CM | POA: Insufficient documentation

## 2018-04-29 DIAGNOSIS — R41840 Attention and concentration deficit: Secondary | ICD-10-CM | POA: Insufficient documentation

## 2018-04-29 DIAGNOSIS — Z79899 Other long term (current) drug therapy: Secondary | ICD-10-CM | POA: Insufficient documentation

## 2018-04-29 DIAGNOSIS — F411 Generalized anxiety disorder: Secondary | ICD-10-CM | POA: Insufficient documentation

## 2018-06-14 ENCOUNTER — Telehealth: Payer: Self-pay

## 2018-06-14 ENCOUNTER — Other Ambulatory Visit: Payer: Self-pay | Admitting: Nurse Practitioner

## 2018-06-14 DIAGNOSIS — R41840 Attention and concentration deficit: Secondary | ICD-10-CM

## 2018-06-14 MED ORDER — AMPHETAMINE-DEXTROAMPHETAMINE 30 MG PO TABS: 1.0000 | ORAL_TABLET | Freq: Two times a day (BID) | ORAL | 0 refills | Status: DC

## 2018-06-14 NOTE — Telephone Encounter (Signed)
Pt advised  We send med and also called [facility]  And canceled pres

## 2018-06-14 NOTE — Progress Notes (Signed)
Resent new prescription to [facility] in [place]. Next two prescriptions still at [facility].

## 2018-06-14 NOTE — Telephone Encounter (Signed)
Resent new prescription to [facility] in [place]. Next two prescriptions still at [facility].  

## 2018-07-12 ENCOUNTER — Ambulatory Visit: Payer: Self-pay | Admitting: Adult Health

## 2018-07-13 ENCOUNTER — Encounter: Payer: Self-pay | Admitting: Adult Health

## 2018-07-13 ENCOUNTER — Ambulatory Visit (INDEPENDENT_AMBULATORY_CARE_PROVIDER_SITE_OTHER): Payer: Self-pay | Admitting: Adult Health

## 2018-07-13 VITALS — BP 112/76 | HR 125 | Resp 16 | Ht 66.0 in | Wt 148.0 lb

## 2018-07-13 DIAGNOSIS — F411 Generalized anxiety disorder: Secondary | ICD-10-CM

## 2018-07-13 DIAGNOSIS — R41840 Attention and concentration deficit: Secondary | ICD-10-CM

## 2018-07-13 DIAGNOSIS — Z79899 Other long term (current) drug therapy: Secondary | ICD-10-CM

## 2018-07-13 MED ORDER — AMPHETAMINE-DEXTROAMPHETAMINE 30 MG PO TABS: 30.0000 mg | ORAL_TABLET | Freq: Two times a day (BID) | ORAL | 0 refills | Status: DC

## 2018-07-13 MED ORDER — AMPHETAMINE-DEXTROAMPHETAMINE 30 MG PO TABS: 1.0000 | ORAL_TABLET | Freq: Two times a day (BID) | ORAL | 0 refills | Status: DC

## 2018-07-13 NOTE — Patient Instructions (Signed)
Amphetamine; Dextroamphetamine tablets What is this medicine? AMPHETAMINE; DEXTROAMPHETAMINE(am FET a meen; dex troe am FET a meen) is used to treat attention-deficit hyperactivity disorder (ADHD). It may also be used for narcolepsy. Federal law prohibits giving this medicine to any person other than the person for whom it was prescribed. Do not share this medicine with anyone else. This medicine may be used for other purposes; ask your health care provider or pharmacist if you have questions. COMMON BRAND NAME(S): Adderall What should I tell my health care provider before I take this medicine? They need to know if you have any of these conditions: -anxiety or panic attacks -circulation problems in fingers and toes -glaucoma -hardening or blockages of the arteries or heart blood vessels -heart disease or a heart defect -high blood pressure -history of a drug or alcohol abuse problem -history of stroke -kidney disease -liver disease -mental illness -seizures -suicidal thoughts, plans, or attempt; a previous suicide attempt by you or a family member -thyroid disease -Tourette's syndrome -an unusual or allergic reaction to dextroamphetamine, other amphetamines, other medicines, foods, dyes, or preservatives -pregnant or trying to get pregnant -breast-feeding How should I use this medicine? Take this medicine by mouth with a glass of water. Follow the directions on the prescription label. Take your doses at regular intervals. Do not take your medicine more often than directed. Do not suddenly stop your medicine. You must gradually reduce the dose or you may feel withdrawal effects. Ask your doctor or health care professional for advice. Talk to your pediatrician regarding the use of this medicine in children. Special care may be needed. While this drug may be prescribed for children as young as 3 years for selected conditions, precautions do apply. Overdosage: If you think you have taken too  much of this medicine contact a poison control center or emergency room at once. NOTE: This medicine is only for you. Do not share this medicine with others. What if I miss a dose? If you miss a dose, take it as soon as you can. If it is almost time for your next dose, take only that dose. Do not take double or extra doses. What may interact with this medicine? Do not take this medicine with any of the following medications: -MAOIS like Carbex, Eldepryl, Marplan, Nardil, and Parnate -other stimulant medicines for attention disorders, weight loss, or to stay awake This medicine may also interact with the following medications: -acetazolamide -ammonium chloride -antacids -ascorbic acid -atomoxetine -caffeine -certain medicines for blood pressure -certain medicines for depression, anxiety, or psychotic disturbances -certain medicines for seizures like carbamazepine, phenobarbital, phenytoin -certain medicines for stomach problems like cimetidine, famotidine, omeprazole, lansoprazole -cold or allergy medicines -glutamic acid -lithium -meperidine -methenamine; sodium acid phosphate -narcotic medicines for pain -norepinephrine -phenothiazines like chlorpromazine, mesoridazine, prochlorperazine, thioridazine -sodium acid phosphate -sodium bicarbonate This list may not describe all possible interactions. Give your health care provider a list of all the medicines, herbs, non-prescription drugs, or dietary supplements you use. Also tell them if you smoke, drink alcohol, or use illegal drugs. Some items may interact with your medicine. What should I watch for while using this medicine? Visit your doctor or health care professional for regular checks on your progress. This prescription requires that you follow special procedures with your doctor and pharmacy. You will need to have a new written prescription from your doctor every time you need a refill. This medicine may affect your  concentration, or hide signs of tiredness. Until you know how this   medicine affects you, do not drive, ride a bicycle, use machinery, or do anything that needs mental alertness. Tell your doctor or health care professional if this medicine loses its effects, or if you feel you need to take more than the prescribed amount. Do not change the dosage without talking to your doctor or health care professional. Decreased appetite is a common side effect when starting this medicine. Eating small, frequent meals or snacks can help. Talk to your doctor if you continue to have poor eating habits. Height and weight growth of a child taking this medicine will be monitored closely. Do not take this medicine close to bedtime. It may prevent you from sleeping. If you are going to need surgery, a MRI, CT scan, or other procedure, tell your doctor that you are taking this medicine. You may need to stop taking this medicine before the procedure. Tell your doctor or healthcare professional right away if you notice unexplained wounds on your fingers and toes while taking this medicine. You should also tell your healthcare provider if you experience numbness or pain, changes in the skin color, or sensitivity to temperature in your fingers or toes. What side effects may I notice from receiving this medicine? Side effects that you should report to your doctor or health care professional as soon as possible: -allergic reactions like skin rash, itching or hives, swelling of the face, lips, or tongue -changes in vision -chest pain or chest tightness -confusion, trouble speaking or understanding -fast, irregular heartbeat -fingers or toes feel numb, cool, painful -hallucination, loss of contact with reality -high blood pressure -males: prolonged or painful erection -seizures -severe headaches -shortness of breath -suicidal thoughts or other mood changes -trouble walking, dizziness, loss of balance or  coordination -uncontrollable head, mouth, neck, arm, or leg movements Side effects that usually do not require medical attention (report to your doctor or health care professional if they continue or are bothersome): -anxious -headache -loss of appetite -nausea, vomiting -trouble sleeping -weight loss This list may not describe all possible side effects. Call your doctor for medical advice about side effects. You may report side effects to FDA at 1-800-FDA-1088. Where should I keep my medicine? Keep out of the reach of children. This medicine can be abused. Keep your medicine in a safe place to protect it from theft. Do not share this medicine with anyone. Selling or giving away this medicine is dangerous and against the law. Store at room temperature between 15 and 30 degrees C (59 and 86 degrees F). Keep container tightly closed. Throw away any unused medicine after the expiration date. Dispose of properly. This medicine may cause accidental overdose and death if it is taken by other adults, children, or pets. Mix any unused medicine with a substance like cat litter or coffee grounds. Then throw the medicine away in a sealed container like a sealed bag or a coffee can with a lid. Do not use the medicine after the expiration date. NOTE: This sheet is a summary. It may not cover all possible information. If you have questions about this medicine, talk to your doctor, pharmacist, or health care provider.  2018 Elsevier/Gold Standard (2014-06-25 18:44:41)  

## 2018-07-13 NOTE — Progress Notes (Signed)
[facility] [address] [place], [place] [address]  Internal MEDICINE  Office Visit Note  Patient Name: Megan Floyd  [id number]  [id number]  Date of Service: 07/13/2018  Chief Complaint  Patient presents with  . ADHD    medication refills     HPI  Patient is here for follow-up on anxiety as well as ADD.  She continues to report anxiety and irritability despite taking buspirone now for 3 months.  She like to try different medication as she does not feel this is effective for her.  Continues to report issues with concentration and attention.  She works and has 2 small children and she feels like the Adderall helps her maintain her daily life and get her work done.  She denies any side effects reports she sleeps well at night.   Current Medication: Outpatient Encounter Medications as of 07/13/2018  Medication Sig  . amphetamine-dextroamphetamine (ADDERALL) 30 MG tablet Take 1 tablet by mouth 2 (two) times daily.  Megan Floyd 1/20 1-20 MG-MCG tablet Take 1 tablet by mouth daily.  . traMADol (ULTRAM) 50 MG tablet Take 1 tablet (50 mg total) by mouth every 6 (six) hours as needed.  . [DISCONTINUED] amphetamine-dextroamphetamine (ADDERALL) 30 MG tablet Take 1 tablet by mouth 2 (two) times daily.  . [DISCONTINUED] busPIRone (BUSPAR) 10 MG tablet Take 1 tablet (10 mg total) by mouth 2 (two) times daily.  Megan Floyd ON 08/12/2018] amphetamine-dextroamphetamine (ADDERALL) 30 MG tablet Take 1 tablet by mouth 2 (two) times daily.  Megan Floyd ON 09/11/2018] amphetamine-dextroamphetamine (ADDERALL) 30 MG tablet Take 1 tablet by mouth 2 (two) times daily.   No facility-administered encounter medications on file as of 07/13/2018.     Surgical History: Past Surgical History:  Procedure Laterality Date  . TONSILLECTOMY      Medical History: Past Medical History:  Diagnosis Date  . ADHD   . Anxiety   . Depression     Family History: History reviewed. No pertinent family  history.  Social History   Socioeconomic History  . Marital status: Single    Spouse name: Not on file  . Number of children: Not on file  . Years of education: Not on file  . Highest education level: Not on file  Occupational History  . Not on file  Social Needs  . Financial resource strain: Not on file  . Food insecurity:    Worry: Not on file    Inability: Not on file  . Transportation needs:    Medical: Not on file    Non-medical: Not on file  Tobacco Use  . Smoking status: Never Smoker  . Smokeless tobacco: Never Used  Substance and Sexual Activity  . Alcohol use: Yes    Comment: ocassionally  . Drug use: No  . Sexual activity: Not on file  Lifestyle  . Physical activity:    Days per week: Not on file    Minutes per session: Not on file  . Stress: Not on file  Relationships  . Social connections:    Talks on phone: Not on file    Gets together: Not on file    Attends religious service: Not on file    Active member of club or organization: Not on file    Attends meetings of clubs or organizations: Not on file    Relationship status: Not on file  . Intimate partner violence:    Fear of current or ex partner: Not on file    Emotionally  abused: Not on file    Physically abused: Not on file    Forced sexual activity: Not on file  Other Topics Concern  . Not on file  Social History Narrative  . Not on file      Review of Systems  Constitutional: Negative for chills, fatigue and unexpected weight change.  HENT: Negative for congestion, rhinorrhea, sneezing and sore throat.   Eyes: Negative for photophobia, pain and redness.  Respiratory: Negative for cough, chest tightness and shortness of breath.   Cardiovascular: Negative for chest pain and palpitations.  Gastrointestinal: Negative for abdominal pain, constipation, diarrhea, nausea and vomiting.  Endocrine: Negative.   Genitourinary: Negative for dysuria and frequency.  Musculoskeletal: Negative for  arthralgias, back pain, joint swelling and neck pain.  Skin: Negative for rash.  Allergic/Immunologic: Negative.   Neurological: Negative for tremors and numbness.  Hematological: Negative for adenopathy. Does not bruise/bleed easily.  Psychiatric/Behavioral: Negative for behavioral problems and sleep disturbance. The patient is not nervous/anxious.     Vital Signs: BP 112/76 (BP Location: Left Arm, Patient Position: Sitting, Cuff Size: Normal)   Pulse (!) 125   Resp 16   Ht 5\' 6"  (1.676 m)   Wt 148 lb (67.1 kg)   SpO2 99%   BMI 23.89 kg/m    Physical Exam  Constitutional: She is oriented to person, place, and time. She appears well-developed and well-nourished. No distress.  HENT:  Head: Normocephalic and atraumatic.  Mouth/Throat: Oropharynx is clear and moist. No oropharyngeal exudate.  Eyes: Pupils are equal, round, and reactive to light. EOM are normal.  Neck: Normal range of motion. Neck supple. No JVD present. No tracheal deviation present. No thyromegaly present.  Cardiovascular: Normal rate, regular rhythm and normal heart sounds. Exam reveals no gallop and no friction rub.  No murmur heard. Pulmonary/Chest: Effort normal and breath sounds normal. No respiratory distress. She has no wheezes. She has no rales. She exhibits no tenderness.  Abdominal: Soft. There is no tenderness. There is no guarding.  Musculoskeletal: Normal range of motion.  Lymphadenopathy:    She has no cervical adenopathy.  Neurological: She is alert and oriented to person, place, and time. No cranial nerve deficit.  Skin: Skin is warm and dry. She is not diaphoretic.  Psychiatric: She has a normal mood and affect. Her behavior is normal. Judgment and thought content normal.  Nursing note and vitals reviewed.   Assessment/Plan: 1. Generalized anxiety disorder .  Patient's buspirone and start her on Lexapro 10 mg p.o. daily.  We discussed that she would need to take this medication for 6 to 8 weeks  to get max effect.  We also discussed that if she wanted to get off this medicine it would need to be tapered off and she verbalized understanding.  2. Attention and concentration deficit Refill patient's Adderall at this time. - amphetamine-dextroamphetamine (ADDERALL) 30 MG tablet; Take 1 tablet by mouth 2 (two) times daily.  Dispense: 60 tablet; Refill: 0 - amphetamine-dextroamphetamine (ADDERALL) 30 MG tablet; Take 1 tablet by mouth 2 (two) times daily.  Dispense: 60 tablet; Refill: 0 - amphetamine-dextroamphetamine (ADDERALL) 30 MG tablet; Take 1 tablet by mouth 2 (two) times daily.  Dispense: 60 tablet; Refill: 0 Refilled Controlled medications today. Reviewed risks and possible side effects associated with taking Stimulants. Combination of these drugs with other psychotropic medications could cause dizziness and drowsiness. Pt needs to Monitor symptoms and exercise caution in driving and operating heavy machinery to avoid damages to oneself, to others  and to the surroundings. Patient verbalized understanding in this matter. Dependence and abuse for these drugs will be monitored closely. A Controlled substance policy and procedure is on file which allows [facility] to order a urine drug screen test at any visit. Patient understands and agrees with the plan..  3. Encounter for long-term (current) use of medications Patient has recent urine drug screen on file in office.  General Counseling: Megan Floyd verbalizes understanding of the findings of todays visit and agrees with plan of treatment. I have discussed any further diagnostic evaluation that may be needed or ordered today. We also reviewed her medications today. she has been encouraged to call the office with any questions or concerns that should arise related to todays visit.    No orders of the defined types were placed in this encounter.   Meds ordered this encounter  Medications  . amphetamine-dextroamphetamine  (ADDERALL) 30 MG tablet    Sig: Take 1 tablet by mouth 2 (two) times daily.    Dispense:  60 tablet    Refill:  0  . amphetamine-dextroamphetamine (ADDERALL) 30 MG tablet    Sig: Take 1 tablet by mouth 2 (two) times daily.    Dispense:  60 tablet    Refill:  0    Do not fill before 08/12/2018  . amphetamine-dextroamphetamine (ADDERALL) 30 MG tablet    Sig: Take 1 tablet by mouth 2 (two) times daily.    Dispense:  60 tablet    Refill:  0    Please do not fill before 09/11/2018    Time spent: 25 Minutes   This patient was seen by Blima Ledger AGNP-C in Collaboration with Dr Lyndon Code as a part of collaborative care agreement     Johnna Acosta AGNP-C Internal medicine

## 2018-10-15 ENCOUNTER — Ambulatory Visit: Payer: Self-pay | Admitting: Adult Health

## 2018-10-17 ENCOUNTER — Ambulatory Visit: Payer: Self-pay | Admitting: Adult Health

## 2018-10-17 ENCOUNTER — Encounter: Payer: Self-pay | Admitting: Adult Health

## 2018-10-17 VITALS — BP 96/70 | HR 93 | Resp 16 | Ht 66.0 in | Wt 144.0 lb

## 2018-10-17 DIAGNOSIS — F411 Generalized anxiety disorder: Secondary | ICD-10-CM

## 2018-10-17 DIAGNOSIS — R41840 Attention and concentration deficit: Secondary | ICD-10-CM

## 2018-10-17 MED ORDER — AMPHETAMINE-DEXTROAMPHETAMINE 30 MG PO TABS: 30.0000 mg | ORAL_TABLET | Freq: Two times a day (BID) | ORAL | 0 refills | Status: DC

## 2018-10-17 MED ORDER — AMPHETAMINE-DEXTROAMPHETAMINE 30 MG PO TABS
30.0000 mg | ORAL_TABLET | Freq: Two times a day (BID) | ORAL | 0 refills | Status: DC
Start: 2018-10-17 — End: 2019-01-21

## 2018-10-17 NOTE — Progress Notes (Signed)
[facility] [address] [place], [place] [id number]  Internal MEDICINE  Office Visit Note  Patient Name: Megan SouDanielle J Ishii  1992  [id number]  Date of Service: 10/17/2018  Chief Complaint  Patient presents with  . Medical Management of Chronic Issues    medication refill , concerned with chin hairs     HPI  Patient here for 3925-month follow-up medication refills for her attention deficit and concentration disorder.  She reports that her current Adderall dose is working well.  She denies any side effects of the medication.  She continues to be able multitask and accomplish her tasks as expected throughout the day.  No chest pain, shortness of breath, palpitations or any other side effects discerned.  She does report that she is noticed a an increase in hairs on her chin.  She stopping her birth control a few months ago and we discussed the possibility that her bodies hormone balance is trying to regulate itself now that she is stopped the birth control.  We discussed doing some lab work however patient is self-pay at this time and she would like to not spend that money if she does not have to at this moment.  She will let us know if she changes her mind.   Current Medication: Outpatient Encounter Medications as of 10/17/2018  Medication Sig  . traMADol (ULTRAM) 50 MG tablet Take 1 tablet (50 mg total) by mouth every 6 (six) hours as needed.  . [DISCONTINUED] amphetamine-dextroamphetamine (ADDERALL) 30 MG tablet Take 1 tablet by mouth 2 (two) times daily.  . [DISCONTINUED] amphetamine-dextroamphetamine (ADDERALL) 30 MG tablet Take 1 tablet by mouth 2 (two) times daily.  . [DISCONTINUED] amphetamine-dextroamphetamine (ADDERALL) 30 MG tablet Take 1 tablet by mouth 2 (two) times daily.  . [DISCONTINUED] JUNEL 1/20 1-20 MG-MCG tablet Take 1 tablet by mouth daily.  Marland Kitchen. amphetamine-dextroamphetamine (ADDERALL) 30 MG tablet Take 1 tablet by mouth 2 (two) times daily.  Marland Kitchen.  amphetamine-dextroamphetamine (ADDERALL) 30 MG tablet Take 1 tablet by mouth 2 (two) times daily.  Melene Muller. [START ON 12/16/2018] amphetamine-dextroamphetamine (ADDERALL) 30 MG tablet Take 1 tablet by mouth 2 (two) times daily.   No facility-administered encounter medications on file as of 10/17/2018.     Surgical History: Past Surgical History:  Procedure Laterality Date  . TONSILLECTOMY      Medical History: Past Medical History:  Diagnosis Date  . ADHD   . Anxiety   . Depression     Family History: History reviewed. No pertinent family history.  Social History   Socioeconomic History  . Marital status: Single    Spouse name: Not on file  . Number of children: Not on file  . Years of education: Not on file  . Highest education level: Not on file  Occupational History  . Not on file  Social Needs  . Financial resource strain: Not on file  . Food insecurity:    Worry: Not on file    Inability: Not on file  . Transportation needs:    Medical: Not on file    Non-medical: Not on file  Tobacco Use  . Smoking status: Never Smoker  . Smokeless tobacco: Never Used  Substance and Sexual Activity  . Alcohol use: Yes    Comment: ocassionally  . Drug use: No  . Sexual activity: Not on file  Lifestyle  . Physical activity:    Days per week: Not on file    Minutes per session: Not on file  . Stress:  Not on file  Relationships  . Social connections:    Talks on phone: Not on file    Gets together: Not on file    Attends religious service: Not on file    Active member of club or organization: Not on file    Attends meetings of clubs or organizations: Not on file    Relationship status: Not on file  . Intimate partner violence:    Fear of current or ex partner: Not on file    Emotionally abused: Not on file    Physically abused: Not on file    Forced sexual activity: Not on file  Other Topics Concern  . Not on file  Social History Narrative  . Not on file      Review  of Systems  Constitutional: Negative for chills, fatigue and unexpected weight change.  HENT: Negative for congestion, rhinorrhea, sneezing and sore throat.   Eyes: Negative for photophobia, pain and redness.  Respiratory: Negative for cough, chest tightness and shortness of breath.   Cardiovascular: Negative for chest pain and palpitations.  Gastrointestinal: Negative for abdominal pain, constipation, diarrhea, nausea and vomiting.  Endocrine: Negative.   Genitourinary: Negative for dysuria and frequency.  Musculoskeletal: Negative for arthralgias, back pain, joint swelling and neck pain.  Skin: Negative for rash.  Allergic/Immunologic: Negative.   Neurological: Negative for tremors and numbness.  Hematological: Negative for adenopathy. Does not bruise/bleed easily.  Psychiatric/Behavioral: Negative for behavioral problems and sleep disturbance. The patient is not nervous/anxious.     Vital Signs: BP 96/70   Pulse 93   Resp 16   Ht 5\' 6"  (1.676 m)   Wt 144 lb (65.3 kg)   SpO2 94%   BMI 23.24 kg/m    Physical Exam Vitals signs and nursing note reviewed.  Constitutional:      General: She is not in acute distress.    Appearance: She is well-developed. She is not diaphoretic.  HENT:     Head: Normocephalic and atraumatic.     Mouth/Throat:     Pharynx: No oropharyngeal exudate.  Eyes:     Pupils: Pupils are equal, round, and reactive to light.  Neck:     Musculoskeletal: Normal range of motion and neck supple.     Thyroid: No thyromegaly.     Vascular: No JVD.     Trachea: No tracheal deviation.  Cardiovascular:     Rate and Rhythm: Normal rate and regular rhythm.     Heart sounds: Normal heart sounds. No murmur. No friction rub. No gallop.   Pulmonary:     Effort: Pulmonary effort is normal. No respiratory distress.     Breath sounds: Normal breath sounds. No wheezing or rales.  Chest:     Chest wall: No tenderness.  Abdominal:     Palpations: Abdomen is soft.      Tenderness: There is no abdominal tenderness. There is no guarding.  Musculoskeletal: Normal range of motion.  Lymphadenopathy:     Cervical: No cervical adenopathy.  Skin:    General: Skin is warm and dry.  Neurological:     Mental Status: She is alert and oriented to person, place, and time.     Cranial Nerves: No cranial nerve deficit.  Psychiatric:        Behavior: Behavior normal.        Thought Content: Thought content normal.        Judgment: Judgment normal.    Assessment/Plan: 1. Attention and concentration deficit Patients Aderall  RX refilled at this time.  Pt is doing well, and will continue medication as directed.  - amphetamine-dextroamphetamine (ADDERALL) 30 MG tablet; Take 1 tablet by mouth 2 (two) times daily.  Dispense: 60 tablet; Refill: 0 - amphetamine-dextroamphetamine (ADDERALL) 30 MG tablet; Take 1 tablet by mouth 2 (two) times daily.  Dispense: 60 tablet; Refill: 0 - amphetamine-dextroamphetamine (ADDERALL) 30 MG tablet; Take 1 tablet by mouth 2 (two) times daily.  Dispense: 60 tablet; Refill: 0 .Refilled Controlled medications today. Reviewed risks and possible side effects associated with taking Stimulants. Combination of these drugs with other psychotropic medications could cause dizziness and drowsiness. Pt needs to Monitor symptoms and exercise caution in driving and operating heavy machinery to avoid damages to oneself, to others and to the surroundings. Patient verbalized understanding in this matter. Dependence and abuse for these drugs will be monitored closely. A Controlled substance policy and procedure is on file which allows [facility] to order a urine drug screen test at any visit. Patient understands and agrees with the plan..  2. Generalized anxiety disorder Stable, pt denies any issues, and is not currently taking any medications.   General Counseling: Jansen verbalizes understanding of the findings of todays visit and agrees with plan  of treatment. I have discussed any further diagnostic evaluation that may be needed or ordered today. We also reviewed her medications today. she has been encouraged to call the office with any questions or concerns that should arise related to todays visit.    No orders of the defined types were placed in this encounter.   Meds ordered this encounter  Medications  . amphetamine-dextroamphetamine (ADDERALL) 30 MG tablet    Sig: Take 1 tablet by mouth 2 (two) times daily.    Dispense:  60 tablet    Refill:  0  . amphetamine-dextroamphetamine (ADDERALL) 30 MG tablet    Sig: Take 1 tablet by mouth 2 (two) times daily.    Dispense:  60 tablet    Refill:  0    Do not fill before 11/16/2018  . amphetamine-dextroamphetamine (ADDERALL) 30 MG tablet    Sig: Take 1 tablet by mouth 2 (two) times daily.    Dispense:  60 tablet    Refill:  0    Do not fill before 12/16/2018    Time spent: 25 Minutes   This patient was seen by Blima Ledger AGNP-C in Collaboration with Dr Lyndon Code as a part of collaborative care agreement     Johnna Acosta AGNP-C Internal medicine

## 2018-10-23 ENCOUNTER — Other Ambulatory Visit: Payer: Self-pay

## 2018-10-23 ENCOUNTER — Other Ambulatory Visit: Payer: Self-pay | Admitting: Adult Health

## 2018-10-23 MED ORDER — NORETHINDRONE ACET-ETHINYL EST 1-20 MG-MCG PO TABS: ORAL_TABLET | ORAL | 11 refills | Status: DC

## 2018-10-25 ENCOUNTER — Ambulatory Visit: Payer: Self-pay | Admitting: Nurse Practitioner

## 2018-11-21 IMAGING — CT CT ABD-PELV W/ CM
2 of 4 series · 15 of 46 positions shown, 17 images · IV contrast (APPLIED)
Comparison: Renal ultrasound performed 11/30/2016, and CT of the
abdomen and pelvis performed 02/21/2011

CLINICAL DATA: Acute onset of fever and chills. Known right-sided
pyelonephritis. Leukocytosis and right flank pain. Initial
encounter.

EXAM:
CT ABDOMEN AND PELVIS WITH CONTRAST
TECHNIQUE: Multidetector CT imaging of the abdomen and pelvis was performed
using the standard protocol following bolus administration of
intravenous contrast.
CONTRAST:  100mL [id number] IOPAMIDOL ([id number]) INJECTION 61%

[Series 2: routine abd/pel with · axial · 0.72mm/px · z∈[-977,-537]mm · 12 of 102 slices shown, 14 images]
[im 9/102  soft-tissue]
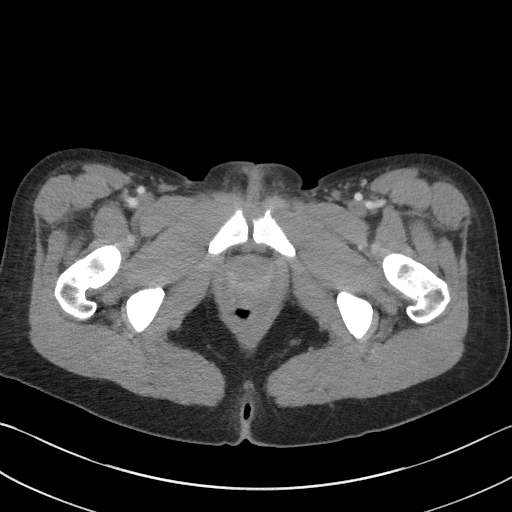
[im 9/102  bone]
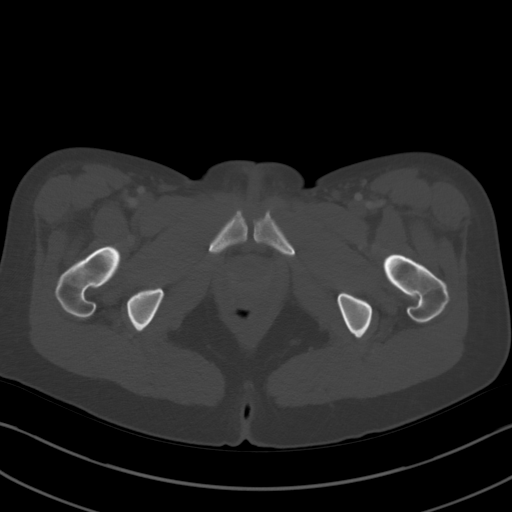
[im 17/102  soft-tissue]
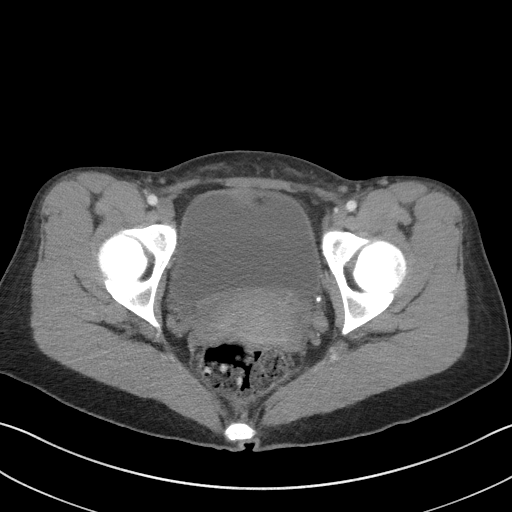
[im 25/102  soft-tissue]
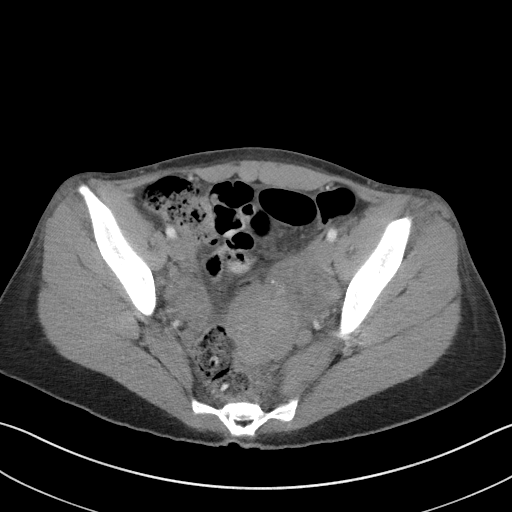
[im 33/102  soft-tissue]
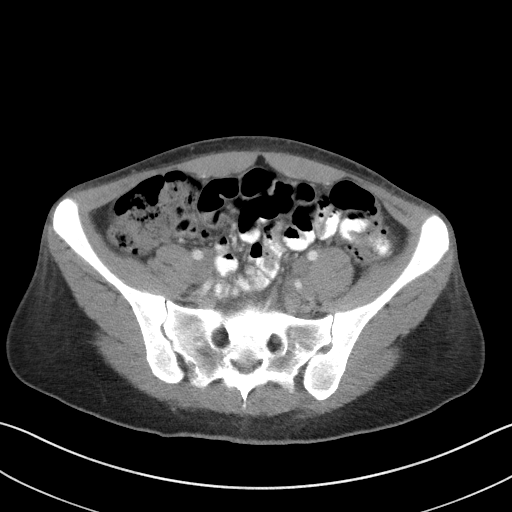
[im 41/102  soft-tissue]
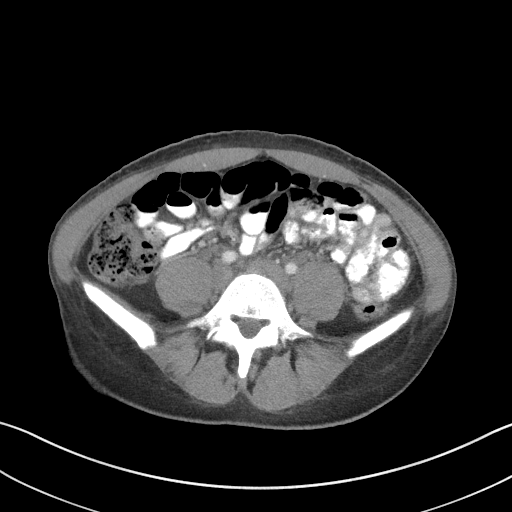
[im 49/102  soft-tissue]
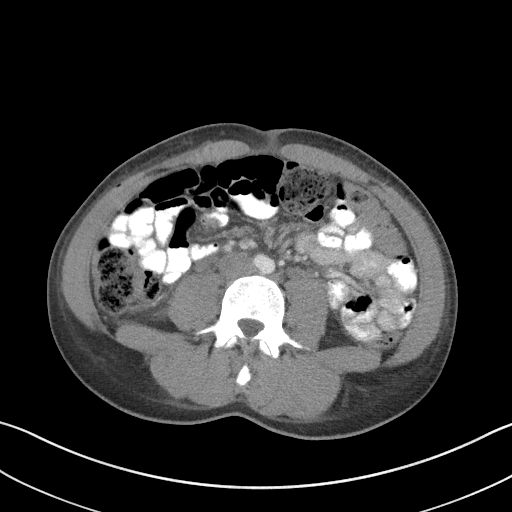
[im 57/102  soft-tissue]
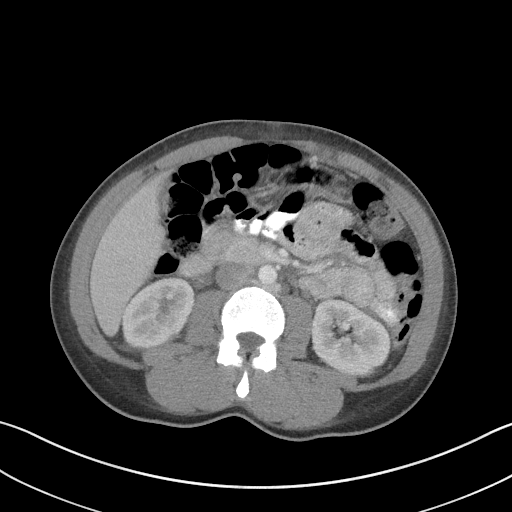
[im 65/102  soft-tissue]
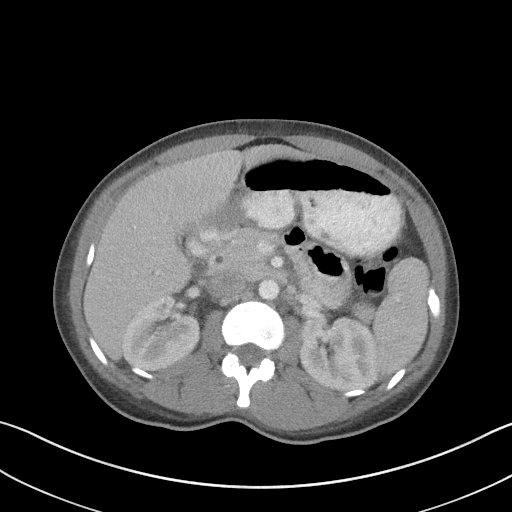
[im 73/102  soft-tissue]
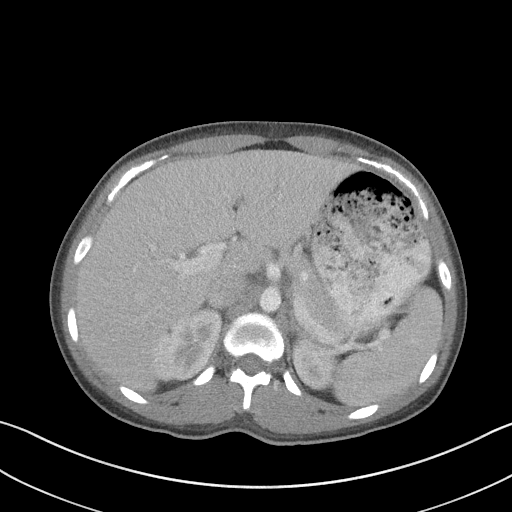
[im 73/102  bone]
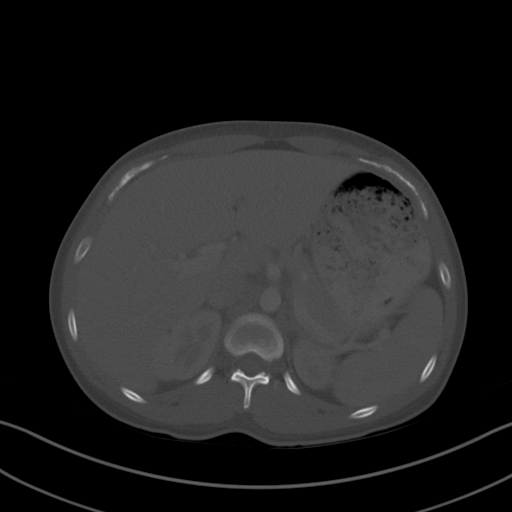
[im 81/102  soft-tissue]
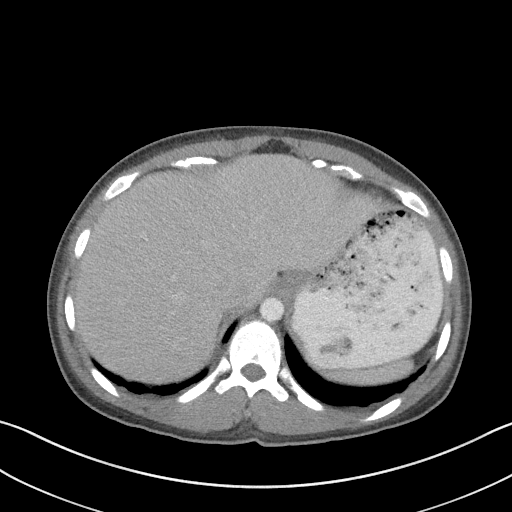
[im 89/102  soft-tissue]
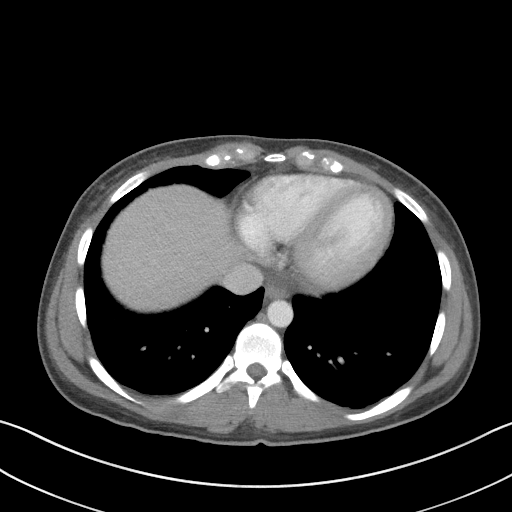
[im 97/102  soft-tissue]
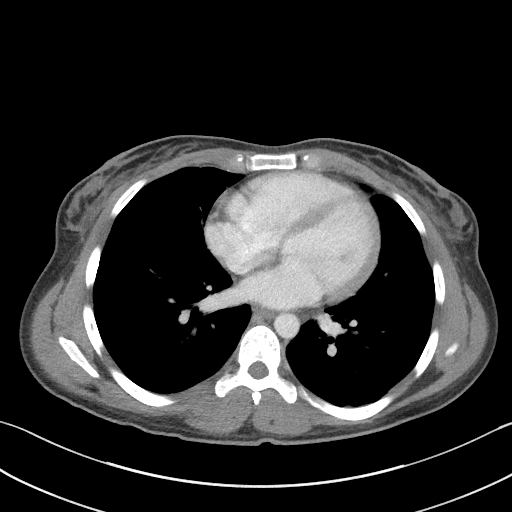

[Series 5: coronal st · coronal · 0.69mm/px · 3 of 79 slices shown]
[im 27/79  soft-tissue]
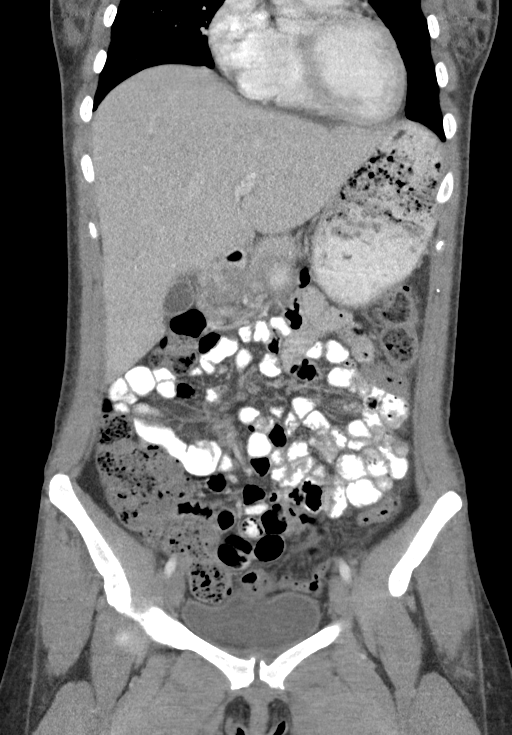
[im 35/79  soft-tissue]
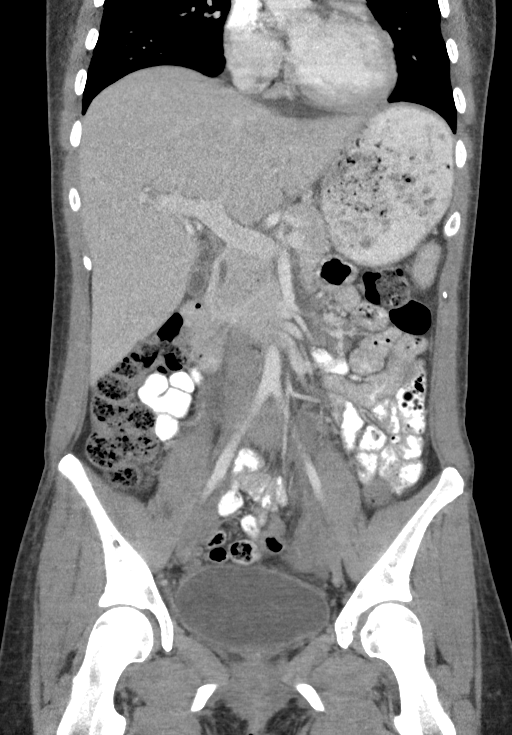
[im 44/79  soft-tissue]
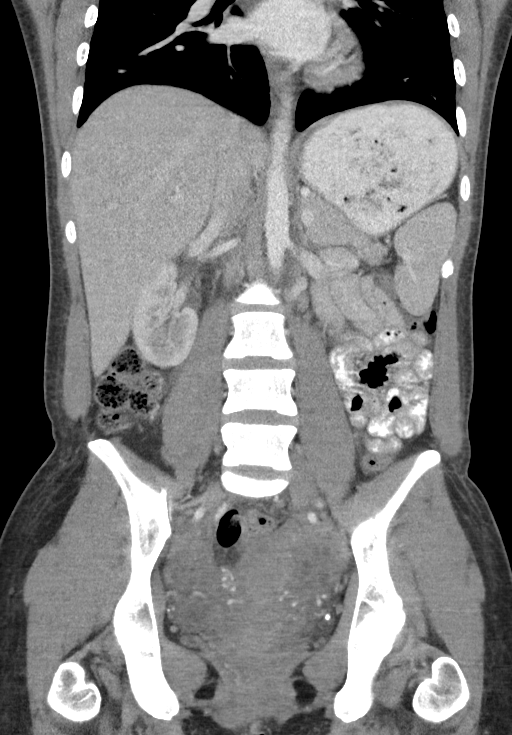

[15 of 46 positions shown; findings below may reference images not displayed]

FINDINGS: Lower chest: Minimal patchy bibasilar atelectasis is noted. The
visualized portions of the mediastinum are unremarkable.

Hepatobiliary: The liver is unremarkable in appearance. The
gallbladder is unremarkable in appearance. The common bile duct
remains normal in caliber.

Pancreas: The pancreas is within normal limits.

Spleen: The spleen is unremarkable in appearance.

Adrenals/Urinary Tract: The adrenal glands are unremarkable in
appearance. The kidneys are within normal limits

Focally decreased enhancement is noted at the medial aspect of the
right kidney, with surrounding soft tissue inflammation, concerning
for acute pyelonephritis. There is no evidence of hydronephrosis. No
renal or ureteral stones are identified. No perinephric stranding is
seen.

Stomach/Bowel: The stomach is unremarkable in appearance. The small
bowel is within normal limits. The appendix is normal in caliber,
without evidence of appendicitis. The colon is unremarkable in
appearance.

Vascular/Lymphatic: The abdominal aorta is unremarkable in
appearance. The inferior vena cava is grossly unremarkable. No
retroperitoneal lymphadenopathy is seen. No pelvic sidewall
lymphadenopathy is identified.

Reproductive: The bladder is mildly distended and within normal
limits. The uterus is grossly unremarkable in appearance. The
ovaries are relatively symmetric. No suspicious adnexal masses are
seen.

Other: No additional soft tissue abnormalities are seen.

Musculoskeletal: No acute osseous abnormalities are identified. The
visualized musculature is unremarkable in appearance.
IMPRESSION: Acute pyelonephritis at the medial aspect of the right kidney.

## 2019-01-16 ENCOUNTER — Ambulatory Visit: Payer: Self-pay | Admitting: Adult Health

## 2019-01-21 ENCOUNTER — Other Ambulatory Visit: Payer: Self-pay

## 2019-01-21 ENCOUNTER — Encounter: Payer: Self-pay | Admitting: Adult Health

## 2019-01-21 ENCOUNTER — Ambulatory Visit: Payer: Self-pay | Admitting: Adult Health

## 2019-01-21 VITALS — Ht 66.0 in | Wt 145.0 lb

## 2019-01-21 DIAGNOSIS — R41840 Attention and concentration deficit: Secondary | ICD-10-CM

## 2019-01-21 DIAGNOSIS — F411 Generalized anxiety disorder: Secondary | ICD-10-CM

## 2019-01-21 MED ORDER — AMPHETAMINE-DEXTROAMPHETAMINE 30 MG PO TABS: 30.0000 mg | ORAL_TABLET | Freq: Two times a day (BID) | ORAL | 0 refills | Status: DC

## 2019-01-21 NOTE — Progress Notes (Signed)
[facility] [address] [place], [place] [address]  Internal MEDICINE  Telephone Visit  Patient Name: Megan Floyd  [id number]  [id number]  Date of Service: 01/21/2019  I connected with the patient at  1125 by video and verified the patients identity using two identifiers.   I discussed the limitations, risks, security and privacy concerns of performing an evaluation and management service by telephone and the availability of in person appointments. I also discussed with the patient that there may be a patient responsible charge related to the service.  The patient expressed understanding and agrees to proceed.    Chief Complaint  Patient presents with  . Telephone Assessment  . Telephone Screen  . Medication Refill    adderall  . Quality Metric Gaps    PAP    HPI  Pt seen via video for follow up on ADD.  She is taking Adderall 30mg  po BID.  She does not report any major side affects.  She does notice some intermittent toe coldness, and dusky looking toes.  This does not cause her any pain, and she combat the cold by wearing socks.  PT is also in need of Pap to close her quality metric gaps.  She is currently self pay.  We discussed the importance of Pap smear and the need for testing.  She understands, and will work on saving some money to get the testing done.    Current Medication: Outpatient Encounter Medications as of 01/21/2019  Medication Sig  . amphetamine-dextroamphetamine (ADDERALL) 30 MG tablet Take 1 tablet by mouth 2 (two) times daily.  Marland Kitchen amphetamine-dextroamphetamine (ADDERALL) 30 MG tablet Take 1 tablet by mouth 2 (two) times daily.  Marland Kitchen amphetamine-dextroamphetamine (ADDERALL) 30 MG tablet Take 1 tablet by mouth 2 (two) times daily.  . norethindrone-ethinyl estradiol (JUNEL 1/20) 1-20 MG-MCG tablet TAKE BY MOUTH AS DIRECTED  . traMADol (ULTRAM) 50 MG tablet Take 1 tablet (50 mg total) by mouth every 6 (six) hours as needed.   No  facility-administered encounter medications on file as of 01/21/2019.     Surgical History: Past Surgical History:  Procedure Laterality Date  . TONSILLECTOMY      Medical History: Past Medical History:  Diagnosis Date  . ADHD   . Anxiety   . Depression     Family History: History reviewed. No pertinent family history.  Social History   Socioeconomic History  . Marital status: Single    Spouse name: Not on file  . Number of children: Not on file  . Years of education: Not on file  . Highest education level: Not on file  Occupational History  . Not on file  Social Needs  . Financial resource strain: Not on file  . Food insecurity:    Worry: Not on file    Inability: Not on file  . Transportation needs:    Medical: Not on file    Non-medical: Not on file  Tobacco Use  . Smoking status: Never Smoker  . Smokeless tobacco: Never Used  Substance and Sexual Activity  . Alcohol use: Yes    Comment: ocassionally  . Drug use: No  . Sexual activity: Not on file  Lifestyle  . Physical activity:    Days per week: Not on file    Minutes per session: Not on file  . Stress: Not on file  Relationships  . Social connections:    Talks on phone: Not on file    Gets together: Not on file  Attends religious service: Not on file    Active member of club or organization: Not on file    Attends meetings of clubs or organizations: Not on file    Relationship status: Not on file  . Intimate partner violence:    Fear of current or ex partner: Not on file    Emotionally abused: Not on file    Physically abused: Not on file    Forced sexual activity: Not on file  Other Topics Concern  . Not on file  Social History Narrative  . Not on file      Review of Systems  Constitutional: Negative for chills, fatigue and unexpected weight change.  HENT: Negative for congestion, rhinorrhea, sneezing and sore throat.   Eyes: Negative for photophobia, pain and redness.  Respiratory:  Negative for cough, chest tightness and shortness of breath.   Cardiovascular: Negative for chest pain and palpitations.  Gastrointestinal: Negative for abdominal pain, constipation, diarrhea, nausea and vomiting.  Endocrine: Negative.   Genitourinary: Negative for dysuria and frequency.  Musculoskeletal: Negative for arthralgias, back pain, joint swelling and neck pain.  Skin: Negative for rash.  Allergic/Immunologic: Negative.   Neurological: Negative for tremors and numbness.  Hematological: Negative for adenopathy. Does not bruise/bleed easily.  Psychiatric/Behavioral: Negative for behavioral problems and sleep disturbance. The patient is not nervous/anxious.     Vital Signs: Ht 5\' 6"  (1.676 m)   Wt 145 lb (65.8 kg)   BMI 23.40 kg/m    Observation/Objective:  Speaking in full sentence, NAD noted.    Assessment/Plan: 1. Attention and concentration deficit Refilled Controlled medications today. Reviewed risks and possible side effects associated with taking Stimulants. Combination of these drugs with other psychotropic medications could cause dizziness and drowsiness. Pt needs to Monitor symptoms and exercise caution in driving and operating heavy machinery to avoid damages to oneself, to others and to the surroundings. Patient verbalized understanding in this matter. Dependence and abuse for these drugs will be monitored closely. A Controlled substance policy and procedure is on file which allows [facility] to order a urine drug screen test at any visit. Patient understands and agrees with the plan.. - amphetamine-dextroamphetamine (ADDERALL) 30 MG tablet; Take 1 tablet by mouth 2 (two) times daily.  Dispense: 60 tablet; Refill: 0 - amphetamine-dextroamphetamine (ADDERALL) 30 MG tablet; Take 1 tablet by mouth 2 (two) times daily.  Dispense: 60 tablet; Refill: 0  2. Generalized anxiety disorder Doing well at this time, denies any recent issues.  Appears  stable.  General Counseling: Syna verbalizes understanding of the findings of today's phone visit and agrees with plan of treatment. I have discussed any further diagnostic evaluation that may be needed or ordered today. We also reviewed her medications today. she has been encouraged to call the office with any questions or concerns that should arise related to todays visit.    No orders of the defined types were placed in this encounter.   No orders of the defined types were placed in this encounter.   Time spent: 11 Minutes    Blima LedgerAdam Kaimana Lurz AGNP-C Internal medicine

## 2019-04-02 ENCOUNTER — Ambulatory Visit: Payer: Self-pay | Admitting: Adult Health

## 2019-04-02 ENCOUNTER — Encounter: Payer: Self-pay | Admitting: Adult Health

## 2019-04-02 ENCOUNTER — Other Ambulatory Visit: Payer: Self-pay

## 2019-04-02 VITALS — BP 118/82 | HR 112 | Resp 16 | Ht 66.0 in | Wt 148.0 lb

## 2019-04-02 DIAGNOSIS — M9251 Juvenile osteochondrosis of tibia and fibula, right leg: Secondary | ICD-10-CM

## 2019-04-02 DIAGNOSIS — R61 Generalized hyperhidrosis: Secondary | ICD-10-CM

## 2019-04-02 DIAGNOSIS — M92521 Juvenile osteochondrosis of tibia tubercle, right leg: Secondary | ICD-10-CM

## 2019-04-02 DIAGNOSIS — R41840 Attention and concentration deficit: Secondary | ICD-10-CM

## 2019-04-02 DIAGNOSIS — Z79899 Other long term (current) drug therapy: Secondary | ICD-10-CM

## 2019-04-02 LAB — POCT URINE DRUG SCREEN
POC Amphetamine UR: POSITIVE — AB
POC BENZODIAZEPINES UR: NOT DETECTED
POC Barbiturate UR: NOT DETECTED
POC Cocaine UR: NOT DETECTED
POC Marijuana UR: NOT DETECTED
POC Methadone UR: NOT DETECTED
POC Methamphetamine UR: NOT DETECTED
POC Opiate Ur: NOT DETECTED
POC Oxycodone UR: NOT DETECTED
POC PHENCYCLIDINE UR: NOT DETECTED
POC TRICYCLICS UR: NOT DETECTED

## 2019-04-02 MED ORDER — ETODOLAC 200 MG PO CAPS: 200.0000 mg | ORAL_CAPSULE | Freq: Two times a day (BID) | ORAL | 2 refills | Status: DC

## 2019-04-02 MED ORDER — GLYCOPYRROLATE 1 MG PO TABS: 1.0000 mg | ORAL_TABLET | Freq: Three times a day (TID) | ORAL | 2 refills | Status: DC

## 2019-04-02 MED ORDER — GLYCOPYRRONIUM TOSYLATE 2.4 % EX PADS: 1.0000 | MEDICATED_PAD | Freq: Once | CUTANEOUS | 1 refills | Status: AC

## 2019-04-02 MED ORDER — AMPHETAMINE-DEXTROAMPHETAMINE 30 MG PO TABS
30.0000 mg | ORAL_TABLET | Freq: Two times a day (BID) | ORAL | 0 refills | Status: DC
Start: 2019-04-02 — End: 2019-05-30

## 2019-04-02 MED ORDER — AMPHETAMINE-DEXTROAMPHETAMINE 30 MG PO TABS: 30.0000 mg | ORAL_TABLET | Freq: Every day | ORAL | 0 refills | Status: DC

## 2019-04-02 NOTE — Progress Notes (Signed)
[facility] [address] [place], [place] [address]  Internal MEDICINE  Office Visit Note  Patient Name: Megan Floyd  [birth year]  [id number]  Date of Service: 04/02/2019  Chief Complaint  Patient presents with  . Medical Management of Chronic Issues    3 month follow up medication refills   . ADHD  . Excessive Sweating    would like to see if there is anything that can be prescribed for this     HPI  Pt is here for 3 month follow up on ADD.  She reports she has been without her last month of Adderall, because she lost her bottle.  She reports good relief of symptoms when using the medication. She has a history of hyperhidrosis and is inquiring about treatments for this. We discussed a few options, however patient is self-pay, and the cost of these treatment could be prohibitive.        Current Medication: Outpatient Encounter Medications as of 04/02/2019  Medication Sig  . amphetamine-dextroamphetamine (ADDERALL) 30 MG tablet Take 1 tablet by mouth 2 (two) times daily.  . norethindrone-ethinyl estradiol (JUNEL 1/20) 1-20 MG-MCG tablet TAKE BY MOUTH AS DIRECTED  . traMADol (ULTRAM) 50 MG tablet Take 1 tablet (50 mg total) by mouth every 6 (six) hours as needed.  . [DISCONTINUED] amphetamine-dextroamphetamine (ADDERALL) 30 MG tablet Take 1 tablet by mouth 2 (two) times daily.   No facility-administered encounter medications on file as of 04/02/2019.     Surgical History: Past Surgical History:  Procedure Laterality Date  . TONSILLECTOMY      Medical History: Past Medical History:  Diagnosis Date  . ADHD   . Anxiety   . Depression     Family History: History reviewed. No pertinent family history.  Social History   Socioeconomic History  . Marital status: Single    Spouse name: Not on file  . Number of children: Not on file  . Years of education: Not on file  . Highest education level: Not on file  Occupational History  . Not on file  Social  Needs  . Financial resource strain: Not on file  . Food insecurity    Worry: Not on file    Inability: Not on file  . Transportation needs    Medical: Not on file    Non-medical: Not on file  Tobacco Use  . Smoking status: Never Smoker  . Smokeless tobacco: Never Used  Substance and Sexual Activity  . Alcohol use: Yes    Comment: ocassionally  . Drug use: No  . Sexual activity: Not on file  Lifestyle  . Physical activity    Days per week: Not on file    Minutes per session: Not on file  . Stress: Not on file  Relationships  . Social Musicianconnections    Talks on phone: Not on file    Gets together: Not on file    Attends religious service: Not on file    Active member of club or organization: Not on file    Attends meetings of clubs or organizations: Not on file    Relationship status: Not on file  . Intimate partner violence    Fear of current or ex partner: Not on file    Emotionally abused: Not on file    Physically abused: Not on file    Forced sexual activity: Not on file  Other Topics Concern  . Not on file  Social History Narrative  . Not on file  Review of Systems  Constitutional: Negative for chills, fatigue and unexpected weight change.  HENT: Negative for congestion, rhinorrhea, sneezing and sore throat.   Eyes: Negative for photophobia, pain and redness.  Respiratory: Negative for cough, chest tightness and shortness of breath.   Cardiovascular: Negative for chest pain and palpitations.  Gastrointestinal: Negative for abdominal pain, constipation, diarrhea, nausea and vomiting.  Endocrine: Negative.   Genitourinary: Negative for dysuria and frequency.  Musculoskeletal: Negative for arthralgias, back pain, joint swelling and neck pain.  Skin: Negative for rash.  Allergic/Immunologic: Negative.   Neurological: Negative for tremors and numbness.  Hematological: Negative for adenopathy. Does not bruise/bleed easily.  Psychiatric/Behavioral: Negative for  behavioral problems and sleep disturbance. The patient is not nervous/anxious.     Vital Signs: BP 118/82 (BP Location: Left Arm, Patient Position: Sitting, Cuff Size: Normal)   Pulse (!) 112   Resp 16   Ht 5\' 6"  (1.676 m)   Wt 148 lb (67.1 kg)   SpO2 99%   BMI 23.89 kg/m    Physical Exam Vitals signs and nursing note reviewed.  Constitutional:      General: She is not in acute distress.    Appearance: She is well-developed. She is not diaphoretic.  HENT:     Head: Normocephalic and atraumatic.     Mouth/Throat:     Pharynx: No oropharyngeal exudate.  Eyes:     Pupils: Pupils are equal, round, and reactive to light.  Neck:     Musculoskeletal: Normal range of motion and neck supple.     Thyroid: No thyromegaly.     Vascular: No JVD.     Trachea: No tracheal deviation.  Cardiovascular:     Rate and Rhythm: Normal rate and regular rhythm.     Heart sounds: Normal heart sounds. No murmur. No friction rub. No gallop.   Pulmonary:     Effort: Pulmonary effort is normal. No respiratory distress.     Breath sounds: Normal breath sounds. No wheezing or rales.  Chest:     Chest wall: No tenderness.  Abdominal:     Palpations: Abdomen is soft.     Tenderness: There is no abdominal tenderness. There is no guarding.  Musculoskeletal: Normal range of motion.  Lymphadenopathy:     Cervical: No cervical adenopathy.  Skin:    General: Skin is warm and dry.  Neurological:     Mental Status: She is alert and oriented to person, place, and time.     Cranial Nerves: No cranial nerve deficit.  Psychiatric:        Behavior: Behavior normal.        Thought Content: Thought content normal.        Judgment: Judgment normal.    Assessment/Plan: 1. Attention and concentration deficit Refilled Controlled medications today. Reviewed risks and possible side effects associated with taking Stimulants. Combination of these drugs with other psychotropic medications could cause dizziness and  drowsiness. Pt needs to Monitor symptoms and exercise caution in driving and operating heavy machinery to avoid damages to oneself, to others and to the surroundings. Patient verbalized understanding in this matter. Dependence and abuse for these drugs will be monitored closely. A Controlled substance policy and procedure is on file which allows [facility] to order a urine drug screen test at any visit. Patient understands and agrees with the plan.. - amphetamine-dextroamphetamine (ADDERALL) 30 MG tablet; Take 1 tablet by mouth 2 (two) times daily.  Dispense: 60 tablet; Refill: 0 - amphetamine-dextroamphetamine (ADDERALL)  30 MG tablet; Take 1 tablet by mouth daily.  Dispense: 60 tablet; Refill: 0  2. High risk medication use - POCT Urine Drug Screen  3. Osgood-Schlatter's disease, right Pt continues to have some right knee pain, restarting etodolac at this time.  - etodolac (LODINE) 200 MG capsule; Take 1 capsule (200 mg total) by mouth 2 (two) times daily.  Dispense: 60 capsule; Refill: 2  4. Hyperhidrosis PT will try Glycopyrrolate as discussed.  - Glycopyrronium Tosylate 2.4 % PADS; Apply 1 each topically once for 1 dose.  Dispense: 30 each; Refill: 1 - glycopyrrolate (ROBINUL) 1 MG tablet; Take 1 tablet (1 mg total) by mouth 3 (three) times daily.  Dispense: 90 tablet; Refill: 2  General Counseling: Megan Floyd verbalizes understanding of the findings of todays visit and agrees with plan of treatment. I have discussed any further diagnostic evaluation that may be needed or ordered today. We also reviewed her medications today. she has been encouraged to call the office with any questions or concerns that should arise related to todays visit.    Orders Placed This Encounter  Procedures  . POCT Urine Drug Screen    No orders of the defined types were placed in this encounter.   Time spent: 15 Minutes   This patient was seen by Blima LedgerAdam Emelee Rodocker AGNP-C in Collaboration with Dr  Lyndon CodeFozia M Khan as a part of collaborative care agreement     Megan Floyd AGNP-C Internal medicine

## 2019-04-24 ENCOUNTER — Ambulatory Visit: Payer: Self-pay | Admitting: Adult Health

## 2019-05-28 ENCOUNTER — Ambulatory Visit: Payer: Self-pay | Admitting: Adult Health

## 2019-05-30 ENCOUNTER — Ambulatory Visit: Payer: Self-pay | Admitting: Adult Health

## 2019-05-30 ENCOUNTER — Encounter: Payer: Self-pay | Admitting: Adult Health

## 2019-05-30 ENCOUNTER — Other Ambulatory Visit: Payer: Self-pay

## 2019-05-30 VITALS — BP 116/64 | HR 100 | Resp 16 | Ht 66.0 in | Wt 149.0 lb

## 2019-05-30 DIAGNOSIS — R61 Generalized hyperhidrosis: Secondary | ICD-10-CM

## 2019-05-30 DIAGNOSIS — F411 Generalized anxiety disorder: Secondary | ICD-10-CM

## 2019-05-30 DIAGNOSIS — R41840 Attention and concentration deficit: Secondary | ICD-10-CM

## 2019-05-30 DIAGNOSIS — M92521 Juvenile osteochondrosis of tibia tubercle, right leg: Secondary | ICD-10-CM

## 2019-05-30 DIAGNOSIS — M9251 Juvenile osteochondrosis of tibia and fibula, right leg: Secondary | ICD-10-CM

## 2019-05-30 MED ORDER — AMPHETAMINE-DEXTROAMPHETAMINE 30 MG PO TABS: 30.0000 mg | ORAL_TABLET | Freq: Two times a day (BID) | ORAL | 0 refills | Status: DC

## 2019-05-30 NOTE — Progress Notes (Signed)
[facility] [address] [place], [place] [address]  Internal MEDICINE  Office Visit Note  Patient Name: Megan Floyd  [birth year]  [id number]  Date of Service: 05/30/2019  Chief Complaint  Patient presents with  . Medical Management of Chronic Issues     2 month follow up   . ADD    medication refill    HPI  Pt is here for follow up.  She currently takes 30mg  of adderall twice daily.  She reports good results at this time and would like to continue the medication.  She has been trying Robinul for hyperhidrosis, and she has had moderate results with this. She has had some difficulty obtaining Lodine, from the pharmacy apparently there is a back order.  Overall she is at her baseline.    Current Medication: Outpatient Encounter Medications as of 05/30/2019  Medication Sig  . amphetamine-dextroamphetamine (ADDERALL) 30 MG tablet Take 1 tablet by mouth daily.  Marland Kitchen. etodolac (LODINE) 200 MG capsule Take 1 capsule (200 mg total) by mouth 2 (two) times daily.  Marland Kitchen. glycopyrrolate (ROBINUL) 1 MG tablet Take 1 tablet (1 mg total) by mouth 3 (three) times daily.  . norethindrone-ethinyl estradiol (JUNEL 1/20) 1-20 MG-MCG tablet TAKE BY MOUTH AS DIRECTED  . traMADol (ULTRAM) 50 MG tablet Take 1 tablet (50 mg total) by mouth every 6 (six) hours as needed.  Marland Kitchen. amphetamine-dextroamphetamine (ADDERALL) 30 MG tablet Take 1 tablet by mouth 2 (two) times daily.  Melene Muller. [START ON 06/29/2019] amphetamine-dextroamphetamine (ADDERALL) 30 MG tablet Take 1 tablet by mouth 2 (two) times daily.  . [DISCONTINUED] amphetamine-dextroamphetamine (ADDERALL) 30 MG tablet Take 1 tablet by mouth 2 (two) times daily. (Patient not taking: Reported on 05/30/2019)   No facility-administered encounter medications on file as of 05/30/2019.     Surgical History: Past Surgical History:  Procedure Laterality Date  . TONSILLECTOMY      Medical History: Past Medical History:  Diagnosis Date  . ADHD   . Anxiety    . Depression     Family History: History reviewed. No pertinent family history.  Social History   Socioeconomic History  . Marital status: Single    Spouse name: Not on file  . Number of children: Not on file  . Years of education: Not on file  . Highest education level: Not on file  Occupational History  . Not on file  Social Needs  . Financial resource strain: Not on file  . Food insecurity    Worry: Not on file    Inability: Not on file  . Transportation needs    Medical: Not on file    Non-medical: Not on file  Tobacco Use  . Smoking status: Never Smoker  . Smokeless tobacco: Never Used  Substance and Sexual Activity  . Alcohol use: Yes    Comment: ocassionally  . Drug use: No  . Sexual activity: Not on file  Lifestyle  . Physical activity    Days per week: Not on file    Minutes per session: Not on file  . Stress: Not on file  Relationships  . Social Musicianconnections    Talks on phone: Not on file    Gets together: Not on file    Attends religious service: Not on file    Active member of club or organization: Not on file    Attends meetings of clubs or organizations: Not on file    Relationship status: Not on file  . Intimate partner violence  Fear of current or ex partner: Not on file    Emotionally abused: Not on file    Physically abused: Not on file    Forced sexual activity: Not on file  Other Topics Concern  . Not on file  Social History Narrative  . Not on file      Review of Systems  Constitutional: Negative for chills, fatigue and unexpected weight change.  HENT: Negative for congestion, rhinorrhea, sneezing and sore throat.   Eyes: Negative for photophobia, pain and redness.  Respiratory: Negative for cough, chest tightness and shortness of breath.   Cardiovascular: Negative for chest pain and palpitations.  Gastrointestinal: Negative for abdominal pain, constipation, diarrhea, nausea and vomiting.  Endocrine: Negative.   Genitourinary:  Negative for dysuria and frequency.  Musculoskeletal: Negative for arthralgias, back pain, joint swelling and neck pain.  Skin: Negative for rash.  Allergic/Immunologic: Negative.   Neurological: Negative for tremors and numbness.  Hematological: Negative for adenopathy. Does not bruise/bleed easily.  Psychiatric/Behavioral: Negative for behavioral problems and sleep disturbance. The patient is not nervous/anxious.     Vital Signs: BP 116/64   Pulse 100   Resp 16   Ht 5\' 6"  (1.676 m)   Wt 149 lb (67.6 kg)   SpO2 98%   BMI 24.05 kg/m    Physical Exam Vitals signs and nursing note reviewed.  Constitutional:      General: She is not in acute distress.    Appearance: She is well-developed. She is not diaphoretic.  HENT:     Head: Normocephalic and atraumatic.     Mouth/Throat:     Pharynx: No oropharyngeal exudate.  Eyes:     Pupils: Pupils are equal, round, and reactive to light.  Neck:     Musculoskeletal: Normal range of motion and neck supple.     Thyroid: No thyromegaly.     Vascular: No JVD.     Trachea: No tracheal deviation.  Cardiovascular:     Rate and Rhythm: Normal rate and regular rhythm.     Heart sounds: Normal heart sounds. No murmur. No friction rub. No gallop.   Pulmonary:     Effort: Pulmonary effort is normal. No respiratory distress.     Breath sounds: Normal breath sounds. No wheezing or rales.  Chest:     Chest wall: No tenderness.  Abdominal:     Palpations: Abdomen is soft.     Tenderness: There is no abdominal tenderness. There is no guarding.  Musculoskeletal: Normal range of motion.  Lymphadenopathy:     Cervical: No cervical adenopathy.  Skin:    General: Skin is warm and dry.  Neurological:     Mental Status: She is alert and oriented to person, place, and time.     Cranial Nerves: No cranial nerve deficit.  Psychiatric:        Behavior: Behavior normal.        Thought Content: Thought content normal.        Judgment: Judgment  normal.    Assessment/Plan: 1. Attention and concentration deficit Refilled Controlled medications today. Reviewed risks and possible side effects associated with taking Stimulants. Combination of these drugs with other psychotropic medications could cause dizziness and drowsiness. Pt needs to Monitor symptoms and exercise caution in driving and operating heavy machinery to avoid damages to oneself, to others and to the surroundings. Patient verbalized understanding in this matter. Dependence and abuse for these drugs will be monitored closely. A Controlled substance policy and procedure is on file  which allows [facility] to order a urine drug screen test at any visit. Patient understands and agrees with the plan.. - amphetamine-dextroamphetamine (ADDERALL) 30 MG tablet; Take 1 tablet by mouth 2 (two) times daily.  Dispense: 60 tablet; Refill: 0 - amphetamine-dextroamphetamine (ADDERALL) 30 MG tablet; Take 1 tablet by mouth 2 (two) times daily.  Dispense: 60 tablet; Refill: 0  2. Hyperhidrosis She reports moderate results with PO medication.    3. Osgood-Schlatter's disease, right Some breakthrough knee pain at times.  Continue to follow.   4. Generalized anxiety disorder Stable, on current regimen.  Continue at this time.   General Counseling: Mearl verbalizes understanding of the findings of todays visit and agrees with plan of treatment. I have discussed any further diagnostic evaluation that may be needed or ordered today. We also reviewed her medications today. she has been encouraged to call the office with any questions or concerns that should arise related to todays visit.    No orders of the defined types were placed in this encounter.   Meds ordered this encounter  Medications  . amphetamine-dextroamphetamine (ADDERALL) 30 MG tablet    Sig: Take 1 tablet by mouth 2 (two) times daily.    Dispense:  60 tablet    Refill:  0  . amphetamine-dextroamphetamine  (ADDERALL) 30 MG tablet    Sig: Take 1 tablet by mouth 2 (two) times daily.    Dispense:  60 tablet    Refill:  0    Do not fill before 06/29/2019    Time spent: 25 Minutes   This patient was seen by Blima Ledger AGNP-C in Collaboration with Dr Lyndon Code as a part of collaborative care agreement     Johnna Acosta AGNP-C Internal medicine

## 2019-06-11 ENCOUNTER — Telehealth: Payer: Self-pay

## 2019-06-11 NOTE — Telephone Encounter (Signed)
Spoke with phar and fix adderall  Suppose to twice a day we send once a day spoke with mike and ok to fill pres due 9/24/20as per adam

## 2019-06-14 IMAGING — CR DG FOOT COMPLETE 3+V*R*
3 series · 3 of 3 positions shown · non-contrast
Comparison: None.

CLINICAL DATA: Pain and swelling to the right ankle after twisting
injury.

EXAM:
RIGHT FOOT COMPLETE - 3+ VIEW

[foot ap]
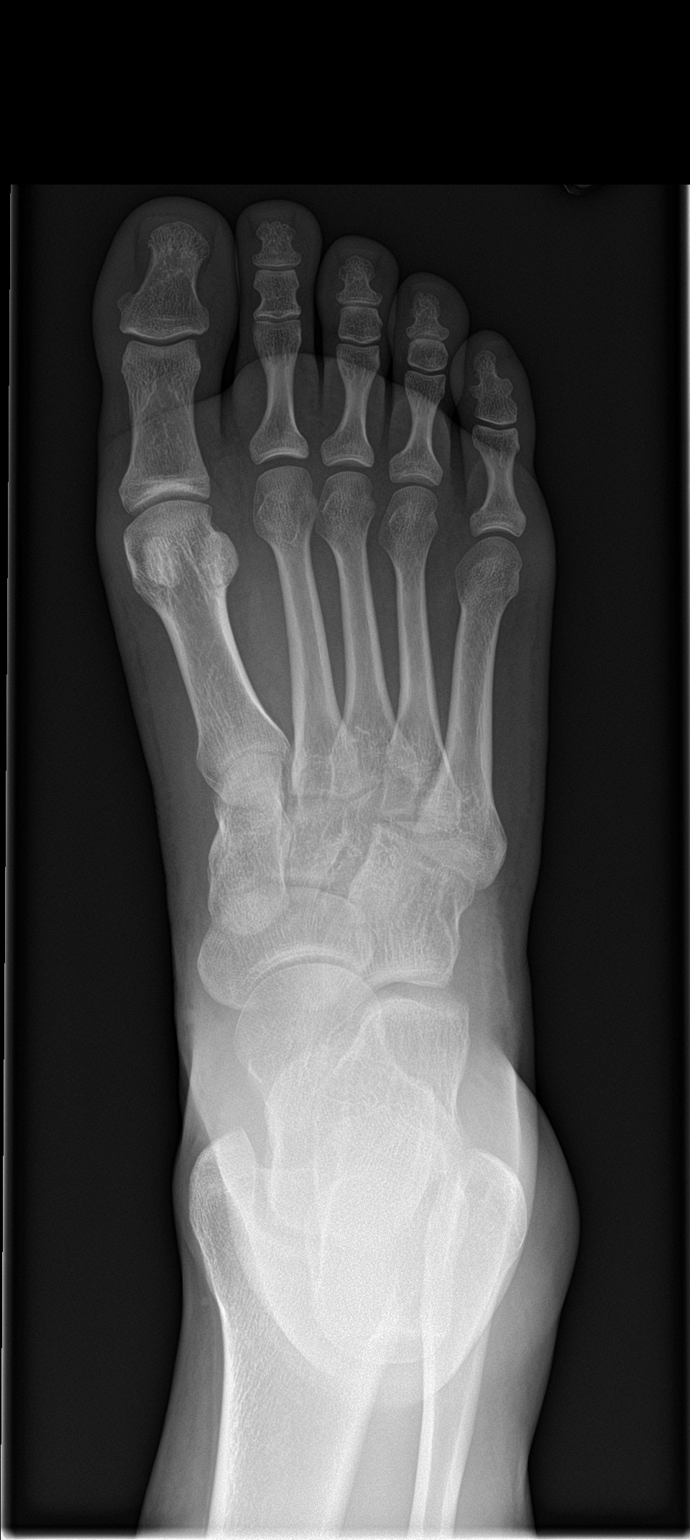

[foot obl]
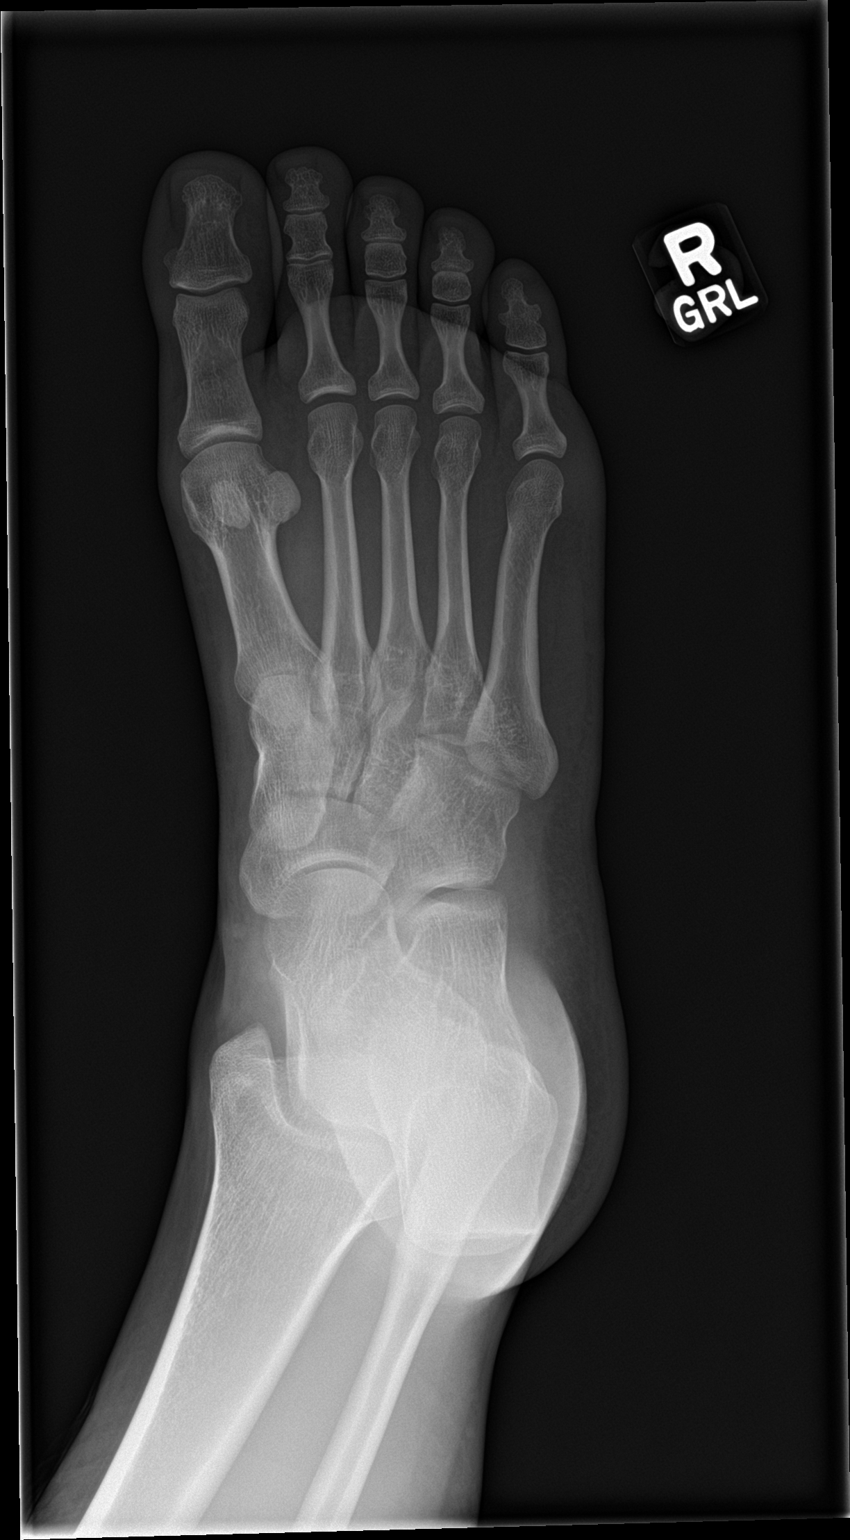

[foot lat]
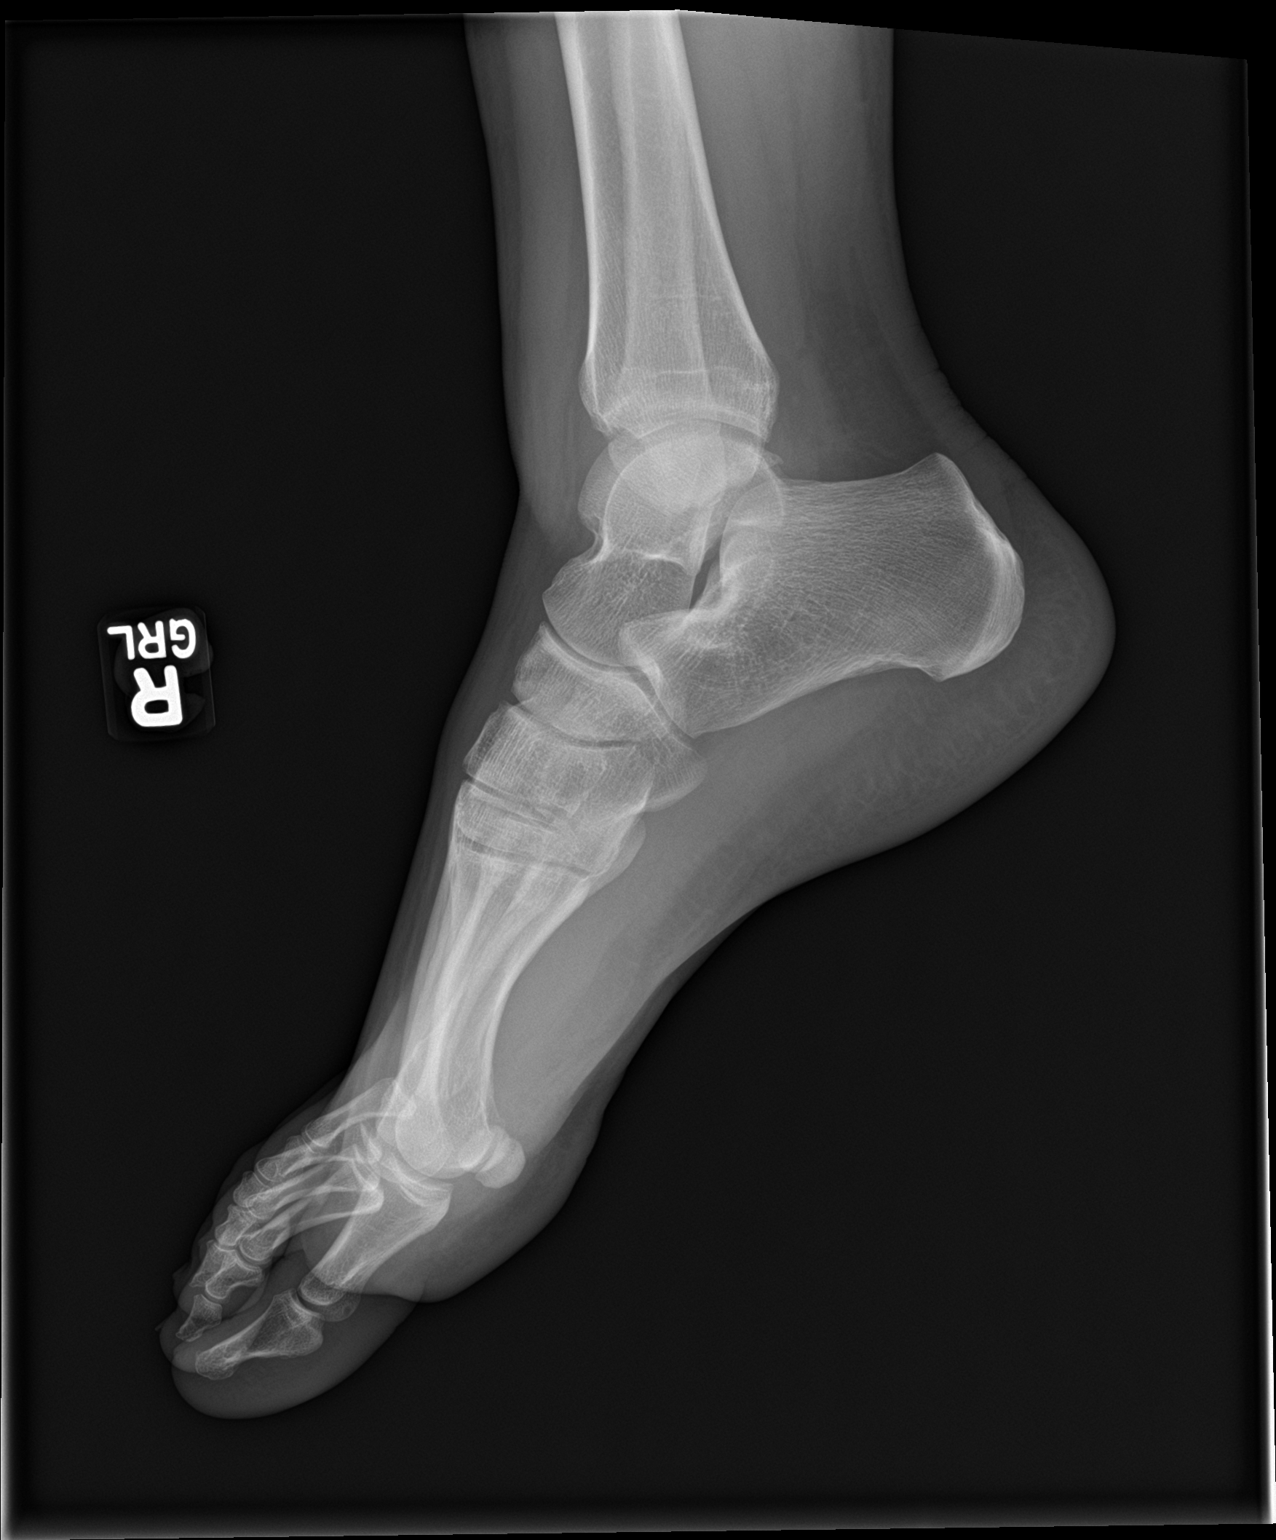

[3 of 3 positions shown; findings below may reference images not displayed]

FINDINGS: There is no evidence of fracture or dislocation. There is no
evidence of arthropathy or other focal bone abnormality. Soft
tissues are unremarkable.
IMPRESSION: Negative.

## 2019-07-23 ENCOUNTER — Telehealth: Payer: Self-pay

## 2019-07-23 NOTE — Telephone Encounter (Signed)
Called lmom informing patient of appointment. klh 

## 2019-07-25 ENCOUNTER — Other Ambulatory Visit: Payer: Self-pay

## 2019-07-25 ENCOUNTER — Encounter: Payer: Self-pay | Admitting: Adult Health

## 2019-07-25 ENCOUNTER — Ambulatory Visit: Payer: Self-pay | Admitting: Adult Health

## 2019-07-25 ENCOUNTER — Encounter (INDEPENDENT_AMBULATORY_CARE_PROVIDER_SITE_OTHER): Payer: Self-pay

## 2019-07-25 VITALS — BP 111/70 | HR 72 | Temp 96.9°F | Resp 16 | Ht 66.0 in | Wt 157.0 lb

## 2019-07-25 DIAGNOSIS — R41840 Attention and concentration deficit: Secondary | ICD-10-CM

## 2019-07-25 DIAGNOSIS — Z79899 Other long term (current) drug therapy: Secondary | ICD-10-CM

## 2019-07-25 DIAGNOSIS — F411 Generalized anxiety disorder: Secondary | ICD-10-CM

## 2019-07-25 LAB — POCT URINE DRUG SCREEN
POC Amphetamine UR: POSITIVE — AB
POC BENZODIAZEPINES UR: NOT DETECTED
POC Cocaine UR: NOT DETECTED
POC Ecstasy UR: NOT DETECTED
POC Marijuana UR: NOT DETECTED
POC Methadone UR: NOT DETECTED
POC Oxycodone UR: NOT DETECTED
POC PHENCYCLIDINE UR: NOT DETECTED
POC TRICYCLICS UR: POSITIVE — AB

## 2019-07-25 MED ORDER — AMPHETAMINE-DEXTROAMPHETAMINE 30 MG PO TABS: 30.0000 mg | ORAL_TABLET | Freq: Two times a day (BID) | ORAL | 0 refills | Status: DC

## 2019-07-25 NOTE — Progress Notes (Signed)
[facility] [address] [place], [place] [address]  Internal MEDICINE  Office Visit Note  Patient Name: Megan SouDanielle J Floyd  [birth year]  [id number]  Date of Service: 07/25/2019  Chief Complaint  Patient presents with  . ADHD  . Anxiety    HPI  Pt is here for follow up on ADHD and anxiety.  Overall she is doing fair.  Her anxiety has been increasing some due to work stress, home stress and covid isolation.  She feels like she is able to control it at this time with exercise.  She currently takes Adderall 30mg  BID for her ADD, and does well with this.  She does not take it most weekends.    Current Medication: Outpatient Encounter Medications as of 07/25/2019  Medication Sig  . amphetamine-dextroamphetamine (ADDERALL) 30 MG tablet Take 1 tablet by mouth 2 (two) times daily.  Marland Kitchen. etodolac (LODINE) 200 MG capsule Take 1 capsule (200 mg total) by mouth 2 (two) times daily.  Marland Kitchen. glycopyrrolate (ROBINUL) 1 MG tablet Take 1 tablet (1 mg total) by mouth 3 (three) times daily.  . norethindrone-ethinyl estradiol (JUNEL 1/20) 1-20 MG-MCG tablet TAKE BY MOUTH AS DIRECTED  . traMADol (ULTRAM) 50 MG tablet Take 1 tablet (50 mg total) by mouth every 6 (six) hours as needed.  . [DISCONTINUED] amphetamine-dextroamphetamine (ADDERALL) 30 MG tablet Take 1 tablet by mouth daily.  . [DISCONTINUED] amphetamine-dextroamphetamine (ADDERALL) 30 MG tablet Take 1 tablet by mouth 2 (two) times daily.  . [DISCONTINUED] amphetamine-dextroamphetamine (ADDERALL) 30 MG tablet Take 1 tablet by mouth 2 (two) times daily.  Marland Kitchen. amphetamine-dextroamphetamine (ADDERALL) 30 MG tablet Take 1 tablet by mouth 2 (two) times daily.   No facility-administered encounter medications on file as of 07/25/2019.     Surgical History: Past Surgical History:  Procedure Laterality Date  . TONSILLECTOMY      Medical History: Past Medical History:  Diagnosis Date  . ADHD   . Anxiety   . Depression     Family  History: History reviewed. No pertinent family history.  Social History   Socioeconomic History  . Marital status: Single    Spouse name: Not on file  . Number of children: Not on file  . Years of education: Not on file  . Highest education level: Not on file  Occupational History  . Not on file  Social Needs  . Financial resource strain: Not on file  . Food insecurity    Worry: Not on file    Inability: Not on file  . Transportation needs    Medical: Not on file    Non-medical: Not on file  Tobacco Use  . Smoking status: Never Smoker  . Smokeless tobacco: Never Used  Substance and Sexual Activity  . Alcohol use: Yes    Comment: ocassionally  . Drug use: No  . Sexual activity: Not on file  Lifestyle  . Physical activity    Days per week: Not on file    Minutes per session: Not on file  . Stress: Not on file  Relationships  . Social Musicianconnections    Talks on phone: Not on file    Gets together: Not on file    Attends religious service: Not on file    Active member of club or organization: Not on file    Attends meetings of clubs or organizations: Not on file    Relationship status: Not on file  . Intimate partner violence    Fear of current or ex partner:  Not on file    Emotionally abused: Not on file    Physically abused: Not on file    Forced sexual activity: Not on file  Other Topics Concern  . Not on file  Social History Narrative  . Not on file      Review of Systems  Constitutional: Negative for chills, fatigue and unexpected weight change.  HENT: Negative for congestion, rhinorrhea, sneezing and sore throat.   Eyes: Negative for photophobia, pain and redness.  Respiratory: Negative for cough, chest tightness and shortness of breath.   Cardiovascular: Negative for chest pain and palpitations.  Gastrointestinal: Negative for abdominal pain, constipation, diarrhea, nausea and vomiting.  Endocrine: Negative.   Genitourinary: Negative for dysuria and  frequency.  Musculoskeletal: Negative for arthralgias, back pain, joint swelling and neck pain.  Skin: Negative for rash.  Allergic/Immunologic: Negative.   Neurological: Negative for tremors and numbness.  Hematological: Negative for adenopathy. Does not bruise/bleed easily.  Psychiatric/Behavioral: Negative for behavioral problems and sleep disturbance. The patient is not nervous/anxious.     Vital Signs: BP 111/70   Pulse 72   Temp (!) 96.9 F (36.1 C)   Resp 16   Ht 5\' 6"  (1.676 m)   Wt 157 lb (71.2 kg)   SpO2 97%   BMI 25.34 kg/m    Physical Exam Vitals signs and nursing note reviewed.  Constitutional:      General: She is not in acute distress.    Appearance: She is well-developed. She is not diaphoretic.  HENT:     Head: Normocephalic and atraumatic.     Mouth/Throat:     Pharynx: No oropharyngeal exudate.  Eyes:     Pupils: Pupils are equal, round, and reactive to light.  Neck:     Musculoskeletal: Normal range of motion and neck supple.     Thyroid: No thyromegaly.     Vascular: No JVD.     Trachea: No tracheal deviation.  Cardiovascular:     Rate and Rhythm: Normal rate and regular rhythm.     Heart sounds: Normal heart sounds. No murmur. No friction rub. No gallop.   Pulmonary:     Effort: Pulmonary effort is normal. No respiratory distress.     Breath sounds: Normal breath sounds. No wheezing or rales.  Chest:     Chest wall: No tenderness.  Abdominal:     Palpations: Abdomen is soft.     Tenderness: There is no abdominal tenderness. There is no guarding.  Musculoskeletal: Normal range of motion.  Lymphadenopathy:     Cervical: No cervical adenopathy.  Skin:    General: Skin is warm and dry.  Neurological:     Mental Status: She is alert and oriented to person, place, and time.     Cranial Nerves: No cranial nerve deficit.  Psychiatric:        Behavior: Behavior normal.        Thought Content: Thought content normal.        Judgment: Judgment  normal.     Assessment/Plan: 1. Attention and concentration deficit Refilled Controlled medications today. Reviewed risks and possible side effects associated with taking Stimulants. Combination of these drugs with other psychotropic medications could cause dizziness and drowsiness. Pt needs to Monitor symptoms and exercise caution in driving and operating heavy machinery to avoid damages to oneself, to others and to the surroundings. Patient verbalized understanding in this matter. Dependence and abuse for these drugs will be monitored closely. A Controlled substance policy and procedure  is on file which allows [facility] to order a urine drug screen test at any visit. Patient understands and agrees with the plan.. - amphetamine-dextroamphetamine (ADDERALL) 30 MG tablet; Take 1 tablet by mouth 2 (two) times daily.  Dispense: 60 tablet; Refill: 0  2. Generalized anxiety disorder Controlled at this time.  Continued to encourage good coping strategy's at this time.   3. Encounter for long-term (current) use of high-risk medication - POCT Urine Drug Screen  General Counseling: Karmela verbalizes understanding of the findings of todays visit and agrees with plan of treatment. I have discussed any further diagnostic evaluation that may be needed or ordered today. We also reviewed her medications today. she has been encouraged to call the office with any questions or concerns that should arise related to todays visit.    Orders Placed This Encounter  Procedures  . POCT Urine Drug Screen    Meds ordered this encounter  Medications  . amphetamine-dextroamphetamine (ADDERALL) 30 MG tablet    Sig: Take 1 tablet by mouth 2 (two) times daily.    Dispense:  60 tablet    Refill:  0  . amphetamine-dextroamphetamine (ADDERALL) 30 MG tablet    Sig: Take 1 tablet by mouth 2 (two) times daily.    Dispense:  60 tablet    Refill:  0    Time spent: 25 Minutes   This patient was seen by  Orson Gear AGNP-C in Collaboration with Dr Lavera Guise as a part of collaborative care agreement     Kendell Bane AGNP-C Internal medicine

## 2019-09-17 ENCOUNTER — Telehealth: Payer: Self-pay

## 2019-09-17 NOTE — Telephone Encounter (Signed)
Called lmom informing patient of appointment. klh 

## 2019-09-19 ENCOUNTER — Ambulatory Visit: Payer: Self-pay | Admitting: Adult Health

## 2019-09-19 ENCOUNTER — Encounter: Payer: Self-pay | Admitting: Adult Health

## 2019-09-19 ENCOUNTER — Other Ambulatory Visit: Payer: Self-pay

## 2019-09-19 DIAGNOSIS — F411 Generalized anxiety disorder: Secondary | ICD-10-CM

## 2019-09-19 DIAGNOSIS — R41840 Attention and concentration deficit: Secondary | ICD-10-CM

## 2019-09-19 MED ORDER — AMPHETAMINE-DEXTROAMPHETAMINE 30 MG PO TABS: 30.0000 mg | ORAL_TABLET | Freq: Two times a day (BID) | ORAL | 0 refills | Status: DC

## 2019-09-19 NOTE — Progress Notes (Signed)
[facility] [address] [place], [place] [address]  Internal MEDICINE  Telephone Visit  Patient Name: Megan Floyd  [id number]  [id number]  Date of Service: 09/19/2019  I connected with the patient at 936 by telephone and verified the patients identity using two identifiers.   I discussed the limitations, risks, security and privacy concerns of performing an evaluation and management service by telephone and the availability of in person appointments. I also discussed with the patient that there may be a patient responsible charge related to the service.  The patient expressed understanding and agrees to proceed.    Chief Complaint  Patient presents with  . Telephone Assessment  . Telephone Screen  . Anxiety  . ADHD    HPI  Pt is seen via video. She is follow up on ADHd and anxiety. She reports she is doing well.  She is adapting to having two kids in and out of school with the new covid schedule.  She continues to take 30mg  of adderall bid, and does well.  Denies any side effects of medication.    Current Medication: Outpatient Encounter Medications as of 09/19/2019  Medication Sig  . amphetamine-dextroamphetamine (ADDERALL) 30 MG tablet Take 1 tablet by mouth 2 (two) times daily.  09/21/2019 etodolac (LODINE) 200 MG capsule Take 1 capsule (200 mg total) by mouth 2 (two) times daily.  Marland Kitchen glycopyrrolate (ROBINUL) 1 MG tablet Take 1 tablet (1 mg total) by mouth 3 (three) times daily.  . norethindrone-ethinyl estradiol (JUNEL 1/20) 1-20 MG-MCG tablet TAKE BY MOUTH AS DIRECTED  . traMADol (ULTRAM) 50 MG tablet Take 1 tablet (50 mg total) by mouth every 6 (six) hours as needed.  . [DISCONTINUED] amphetamine-dextroamphetamine (ADDERALL) 30 MG tablet Take 1 tablet by mouth 2 (two) times daily.  . [DISCONTINUED] amphetamine-dextroamphetamine (ADDERALL) 30 MG tablet Take 1 tablet by mouth 2 (two) times daily.  Marland Kitchen ON 10/19/2019] amphetamine-dextroamphetamine (ADDERALL) 30 MG  tablet Take 1 tablet by mouth 2 (two) times daily.   No facility-administered encounter medications on file as of 09/19/2019.    Surgical History: Past Surgical History:  Procedure Laterality Date  . TONSILLECTOMY      Medical History: Past Medical History:  Diagnosis Date  . ADHD   . Anxiety   . Depression     Family History: History reviewed. No pertinent family history.  Social History   Socioeconomic History  . Marital status: Single    Spouse name: Not on file  . Number of children: Not on file  . Years of education: Not on file  . Highest education level: Not on file  Occupational History  . Not on file  Tobacco Use  . Smoking status: Never Smoker  . Smokeless tobacco: Never Used  Substance and Sexual Activity  . Alcohol use: Yes    Comment: ocassionally  . Drug use: No  . Sexual activity: Not on file  Other Topics Concern  . Not on file  Social History Narrative  . Not on file   Social Determinants of Health   Financial Resource Strain:   . Difficulty of Paying Living Expenses: Not on file  Food Insecurity:   . Worried About 09/21/2019 in the Last Year: Not on file  . Ran Out of Food in the Last Year: Not on file  Transportation Needs:   . Lack of Transportation (Medical): Not on file  . Lack of Transportation (Non-Medical): Not on file  Physical Activity:   .  Days of Exercise per Week: Not on file  . Minutes of Exercise per Session: Not on file  Stress:   . Feeling of Stress : Not on file  Social Connections:   . Frequency of Communication with Friends and Family: Not on file  . Frequency of Social Gatherings with Friends and Family: Not on file  . Attends Religious Services: Not on file  . Active Member of Clubs or Organizations: Not on file  . Attends Archivist Meetings: Not on file  . Marital Status: Not on file  Intimate Partner Violence:   . Fear of Current or Ex-Partner: Not on file  . Emotionally Abused: Not on  file  . Physically Abused: Not on file  . Sexually Abused: Not on file      Review of Systems  Constitutional: Negative for chills, fatigue and unexpected weight change.  HENT: Negative for congestion, rhinorrhea, sneezing and sore throat.   Eyes: Negative for photophobia, pain and redness.  Respiratory: Negative for cough, chest tightness and shortness of breath.   Cardiovascular: Negative for chest pain and palpitations.  Gastrointestinal: Negative for abdominal pain, constipation, diarrhea, nausea and vomiting.  Endocrine: Negative.   Genitourinary: Negative for dysuria and frequency.  Musculoskeletal: Negative for arthralgias, back pain, joint swelling and neck pain.  Skin: Negative for rash.  Allergic/Immunologic: Negative.   Neurological: Negative for tremors and numbness.  Hematological: Negative for adenopathy. Does not bruise/bleed easily.  Psychiatric/Behavioral: Negative for behavioral problems and sleep disturbance. The patient is not nervous/anxious.     Vital Signs: There were no vitals taken for this visit.   Observation/Objective:  Well appearing, Nad noted.    Assessment/Plan: 1. Attention and concentration deficit Refilled Controlled medications today. Reviewed risks and possible side effects associated with taking Stimulants. Combination of these drugs with other psychotropic medications could cause dizziness and drowsiness. Pt needs to Monitor symptoms and exercise caution in driving and operating heavy machinery to avoid damages to oneself, to others and to the surroundings. Patient verbalized understanding in this matter. Dependence and abuse for these drugs will be monitored closely. A Controlled substance policy and procedure is on file which allows [facility] to order a urine drug screen test at any visit. Patient understands and agrees with the plan.. - amphetamine-dextroamphetamine (ADDERALL) 30 MG tablet; Take 1 tablet by mouth 2 (two)  times daily.  Dispense: 60 tablet; Refill: 0 - amphetamine-dextroamphetamine (ADDERALL) 30 MG tablet; Take 1 tablet by mouth 2 (two) times daily.  Dispense: 60 tablet; Refill: 0  2. Generalized anxiety disorder Stable, continue current copping strategies.   General Counseling: Abrianna verbalizes understanding of the findings of today's phone visit and agrees with plan of treatment. I have discussed any further diagnostic evaluation that may be needed or ordered today. We also reviewed her medications today. she has been encouraged to call the office with any questions or concerns that should arise related to todays visit.    No orders of the defined types were placed in this encounter.   Meds ordered this encounter  Medications  . amphetamine-dextroamphetamine (ADDERALL) 30 MG tablet    Sig: Take 1 tablet by mouth 2 (two) times daily.    Dispense:  60 tablet    Refill:  0  . amphetamine-dextroamphetamine (ADDERALL) 30 MG tablet    Sig: Take 1 tablet by mouth 2 (two) times daily.    Dispense:  60 tablet    Refill:  0    Do not fill before  10/19/2019    Time spent: 15 Minutes    Blima Ledger AGNP-C Internal medicine

## 2019-11-12 ENCOUNTER — Telehealth: Payer: Self-pay

## 2019-11-12 NOTE — Telephone Encounter (Signed)
Confirmed appointment on 11/14/2019 and screened for covid. klh 

## 2019-11-14 ENCOUNTER — Other Ambulatory Visit: Payer: Self-pay

## 2019-11-14 ENCOUNTER — Encounter: Payer: Self-pay | Admitting: Adult Health

## 2019-11-14 ENCOUNTER — Ambulatory Visit: Payer: Self-pay | Admitting: Adult Health

## 2019-11-14 VITALS — BP 124/86 | HR 88 | Temp 97.5°F | Resp 16 | Ht 66.0 in | Wt 152.8 lb

## 2019-11-14 DIAGNOSIS — Z79899 Other long term (current) drug therapy: Secondary | ICD-10-CM

## 2019-11-14 DIAGNOSIS — R5383 Other fatigue: Secondary | ICD-10-CM

## 2019-11-14 DIAGNOSIS — R300 Dysuria: Secondary | ICD-10-CM

## 2019-11-14 DIAGNOSIS — F411 Generalized anxiety disorder: Secondary | ICD-10-CM

## 2019-11-14 DIAGNOSIS — R41840 Attention and concentration deficit: Secondary | ICD-10-CM

## 2019-11-14 LAB — POCT URINE DRUG SCREEN
POC Amphetamine UR: POSITIVE — AB
POC BENZODIAZEPINES UR: NOT DETECTED
POC Barbiturate UR: NOT DETECTED
POC Cocaine UR: NOT DETECTED
POC Ecstasy UR: NOT DETECTED
POC Marijuana UR: POSITIVE — AB
POC Methadone UR: NOT DETECTED
POC Methamphetamine UR: NOT DETECTED
POC Opiate Ur: NOT DETECTED
POC Oxycodone UR: NOT DETECTED
POC PHENCYCLIDINE UR: NOT DETECTED
POC TRICYCLICS UR: NOT DETECTED

## 2019-11-14 NOTE — Progress Notes (Signed)
[facility] [address] [place], [place] [address]  Internal MEDICINE  Office Visit Note  Patient Name: Megan Floyd  [id number]  [id number]  Date of Service: 11/14/2019  Chief Complaint  Patient presents with  . Anxiety  . Depression  . Medication Refill    HPI  Pt is her for follow up on anxiety, depression and ADHD.  She reports she has been doing well overall. Denies any side effects from the adderall.  Although she is concerned that it does not feel like it is working anymore.  She has excessive daytime sleepiness. She has not had blood drawn in awhile, as she is self pay.  Will give her indigent lab slip at this time to check her labs, and then consider sleep study.    Current Medication: Outpatient Encounter Medications as of 11/14/2019  Medication Sig  . amphetamine-dextroamphetamine (ADDERALL) 30 MG tablet Take 1 tablet by mouth 2 (two) times daily.  Marland Kitchen amphetamine-dextroamphetamine (ADDERALL) 30 MG tablet Take 1 tablet by mouth 2 (two) times daily.  Marland Kitchen etodolac (LODINE) 200 MG capsule Take 1 capsule (200 mg total) by mouth 2 (two) times daily.  Marland Kitchen glycopyrrolate (ROBINUL) 1 MG tablet Take 1 tablet (1 mg total) by mouth 3 (three) times daily.  . norethindrone-ethinyl estradiol (JUNEL 1/20) 1-20 MG-MCG tablet TAKE BY MOUTH AS DIRECTED  . traMADol (ULTRAM) 50 MG tablet Take 1 tablet (50 mg total) by mouth every 6 (six) hours as needed.   No facility-administered encounter medications on file as of 11/14/2019.    Surgical History: Past Surgical History:  Procedure Laterality Date  . TONSILLECTOMY      Medical History: Past Medical History:  Diagnosis Date  . ADHD   . Anxiety   . Depression     Family History: History reviewed. No pertinent family history.  Social History   Socioeconomic History  . Marital status: Single    Spouse name: Not on file  . Number of children: Not on file  . Years of education: Not on file  . Highest education  level: Not on file  Occupational History  . Not on file  Tobacco Use  . Smoking status: Never Smoker  . Smokeless tobacco: Never Used  Substance and Sexual Activity  . Alcohol use: Yes    Comment: ocassionally  . Drug use: No  . Sexual activity: Not on file  Other Topics Concern  . Not on file  Social History Narrative  . Not on file   Social Determinants of Health   Financial Resource Strain:   . Difficulty of Paying Living Expenses:   Food Insecurity:   . Worried About Programme researcher, broadcasting/film/video in the Last Year:   . Barista in the Last Year:   Transportation Needs:   . Freight forwarder (Medical):   Marland Kitchen Lack of Transportation (Non-Medical):   Physical Activity:   . Days of Exercise per Week:   . Minutes of Exercise per Session:   Stress:   . Feeling of Stress :   Social Connections:   . Frequency of Communication with Friends and Family:   . Frequency of Social Gatherings with Friends and Family:   . Attends Religious Services:   . Active Member of Clubs or Organizations:   . Attends Banker Meetings:   Marland Kitchen Marital Status:   Intimate Partner Violence:   . Fear of Current or Ex-Partner:   . Emotionally Abused:   Marland Kitchen Physically Abused:   .  Sexually Abused:       Review of Systems  Constitutional: Positive for fatigue. Negative for chills and unexpected weight change.  HENT: Negative for congestion, rhinorrhea, sneezing and sore throat.   Eyes: Negative for photophobia, pain and redness.  Respiratory: Negative for cough, chest tightness and shortness of breath.   Cardiovascular: Negative for chest pain and palpitations.  Gastrointestinal: Negative for abdominal pain, constipation, diarrhea, nausea and vomiting.  Endocrine: Negative.   Genitourinary: Negative for dysuria and frequency.  Musculoskeletal: Negative for arthralgias, back pain, joint swelling and neck pain.  Skin: Negative for rash.  Allergic/Immunologic: Negative.   Neurological:  Negative for tremors and numbness.  Hematological: Negative for adenopathy. Does not bruise/bleed easily.  Psychiatric/Behavioral: Negative for behavioral problems and sleep disturbance. The patient is not nervous/anxious.     Vital Signs: BP 124/86   Pulse 88   Temp (!) 97.5 F (36.4 C)   Resp 16   Ht 5\' 6"  (1.676 m)   Wt 152 lb 12.8 oz (69.3 kg)   SpO2 98%   BMI 24.66 kg/m    Physical Exam Vitals and nursing note reviewed.  Constitutional:      General: She is not in acute distress.    Appearance: She is well-developed. She is not diaphoretic.  HENT:     Head: Normocephalic and atraumatic.     Mouth/Throat:     Pharynx: No oropharyngeal exudate.  Eyes:     Pupils: Pupils are equal, round, and reactive to light.  Neck:     Thyroid: No thyromegaly.     Vascular: No JVD.     Trachea: No tracheal deviation.  Cardiovascular:     Rate and Rhythm: Normal rate and regular rhythm.     Heart sounds: Normal heart sounds. No murmur. No friction rub. No gallop.   Pulmonary:     Effort: Pulmonary effort is normal. No respiratory distress.     Breath sounds: Normal breath sounds. No wheezing or rales.  Chest:     Chest wall: No tenderness.  Abdominal:     Palpations: Abdomen is soft.     Tenderness: There is no abdominal tenderness. There is no guarding.  Musculoskeletal:        General: Normal range of motion.     Cervical back: Normal range of motion and neck supple.  Lymphadenopathy:     Cervical: No cervical adenopathy.  Skin:    General: Skin is warm and dry.  Neurological:     Mental Status: She is alert and oriented to person, place, and time.     Cranial Nerves: No cranial nerve deficit.  Psychiatric:        Behavior: Behavior normal.        Thought Content: Thought content normal.        Judgment: Judgment normal.     Assessment/Plan: 1. Attention and concentration deficit Well get labs drawn and follow up when labs are resulted. Hold any RX's for now.  -  POCT Urine Drug Screen  2. Generalized anxiety disorder Stable, continue to follow.  3. Other fatigue Labs sent on indigent lab slip with patient.   4. Encounter for long-term (current) use of high-risk medication - POCT Urine Drug Screen  General Counseling: Tamalyn verbalizes understanding of the findings of todays visit and agrees with plan of treatment. I have discussed any further diagnostic evaluation that may be needed or ordered today. We also reviewed her medications today. she has been encouraged to call the office  with any questions or concerns that should arise related to todays visit.    Orders Placed This Encounter  Procedures  . POCT Urine Drug Screen    No orders of the defined types were placed in this encounter.   Time spent: 25 Minutes   This patient was seen by Orson Gear AGNP-C in Collaboration with Dr Lavera Guise as a part of collaborative care agreement     Kendell Bane AGNP-C Internal medicine

## 2019-12-11 ENCOUNTER — Telehealth: Payer: Self-pay

## 2019-12-11 NOTE — Telephone Encounter (Signed)
CALLED PT PER ADAM TO NOTIFY HER THAT HER LABS LOOKED GOOD OVERALL, BUT HER VITAMIN D IS ONLY A LITTLE LOW AND THAT ADAM RECOMMENDS TAKING OTC VITAMIN D AND IRON IS A LITTLE ELEVATED AND THAT ADAM WILL DISCUSS THIS WITH PT ON THE NEXT VISIT PT HAS.

## 2019-12-16 ENCOUNTER — Other Ambulatory Visit: Payer: Self-pay

## 2019-12-16 NOTE — Telephone Encounter (Signed)
error 

## 2019-12-18 ENCOUNTER — Other Ambulatory Visit: Payer: Self-pay

## 2019-12-18 DIAGNOSIS — R41840 Attention and concentration deficit: Secondary | ICD-10-CM

## 2019-12-19 MED ORDER — AMPHETAMINE-DEXTROAMPHETAMINE 30 MG PO TABS: 30.0000 mg | ORAL_TABLET | Freq: Two times a day (BID) | ORAL | 0 refills | Status: DC

## 2020-01-07 ENCOUNTER — Telehealth: Payer: Self-pay

## 2020-01-07 NOTE — Telephone Encounter (Signed)
Called lmom informing patient of appointment on 01/09/2020. klh 

## 2020-01-09 ENCOUNTER — Ambulatory Visit: Payer: Self-pay | Admitting: Adult Health

## 2020-01-14 ENCOUNTER — Encounter: Payer: Self-pay | Admitting: Adult Health

## 2020-01-14 ENCOUNTER — Other Ambulatory Visit: Payer: Self-pay

## 2020-01-14 ENCOUNTER — Ambulatory Visit: Payer: Self-pay | Admitting: Adult Health

## 2020-01-14 VITALS — BP 99/60 | HR 87 | Temp 97.3°F | Resp 16 | Ht 66.0 in | Wt 147.0 lb

## 2020-01-14 DIAGNOSIS — R41840 Attention and concentration deficit: Secondary | ICD-10-CM

## 2020-01-14 DIAGNOSIS — F411 Generalized anxiety disorder: Secondary | ICD-10-CM

## 2020-01-14 MED ORDER — AMPHETAMINE-DEXTROAMPHETAMINE 30 MG PO TABS: 30.0000 mg | ORAL_TABLET | Freq: Two times a day (BID) | ORAL | 0 refills | Status: DC

## 2020-01-14 MED ORDER — ESCITALOPRAM OXALATE 10 MG PO TABS: 10.0000 mg | ORAL_TABLET | Freq: Every day | ORAL | 1 refills | Status: DC

## 2020-01-14 NOTE — Progress Notes (Signed)
[facility] [address] [place], [place] [address]  Internal MEDICINE  Office Visit Note  Patient Name: Megan Floyd  [id number]  [id number]  Date of Service: 01/14/2020  Chief Complaint  Patient presents with  . Follow-up  . Depression  . Medication Refill    HPI  Pt is here for follow up on anxiety, depression and ADHD.  Overall she is at her baseline. She denies any side effects of her adderall. She currently takes 30mg  BID.  Her depression and anxiety are well controlled with lexapro.  She denies any new or worsening symptoms.     Current Medication: Outpatient Encounter Medications as of 01/14/2020  Medication Sig  . amphetamine-dextroamphetamine (ADDERALL) 30 MG tablet Take 1 tablet by mouth 2 (two) times daily.  03/15/2020 amphetamine-dextroamphetamine (ADDERALL) 30 MG tablet Take 1 tablet by mouth 2 (two) times daily.  Marland Kitchen etodolac (LODINE) 200 MG capsule Take 1 capsule (200 mg total) by mouth 2 (two) times daily.  Marland Kitchen glycopyrrolate (ROBINUL) 1 MG tablet Take 1 tablet (1 mg total) by mouth 3 (three) times daily.  . norethindrone-ethinyl estradiol (JUNEL 1/20) 1-20 MG-MCG tablet TAKE BY MOUTH AS DIRECTED  . traMADol (ULTRAM) 50 MG tablet Take 1 tablet (50 mg total) by mouth every 6 (six) hours as needed.  . [DISCONTINUED] amphetamine-dextroamphetamine (ADDERALL) 30 MG tablet Take 1 tablet by mouth 2 (two) times daily.  Marland Kitchen ON 02/13/2020] amphetamine-dextroamphetamine (ADDERALL) 30 MG tablet Take 1 tablet by mouth 2 (two) times daily.   No facility-administered encounter medications on file as of 01/14/2020.    Surgical History: Past Surgical History:  Procedure Laterality Date  . TONSILLECTOMY      Medical History: Past Medical History:  Diagnosis Date  . ADHD   . Anxiety   . Depression     Family History: History reviewed. No pertinent family history.  Social History   Socioeconomic History  . Marital status: Single    Spouse name: Not on file   . Number of children: Not on file  . Years of education: Not on file  . Highest education level: Not on file  Occupational History  . Not on file  Tobacco Use  . Smoking status: Never Smoker  . Smokeless tobacco: Never Used  Substance and Sexual Activity  . Alcohol use: Yes    Comment: ocassionally  . Drug use: No  . Sexual activity: Not on file  Other Topics Concern  . Not on file  Social History Narrative  . Not on file   Social Determinants of Health   Financial Resource Strain:   . Difficulty of Paying Living Expenses:   Food Insecurity:   . Worried About 03/15/2020 in the Last Year:   . Programme researcher, broadcasting/film/video in the Last Year:   Transportation Needs:   . Barista (Medical):   Freight forwarder Lack of Transportation (Non-Medical):   Physical Activity:   . Days of Exercise per Week:   . Minutes of Exercise per Session:   Stress:   . Feeling of Stress :   Social Connections:   . Frequency of Communication with Friends and Family:   . Frequency of Social Gatherings with Friends and Family:   . Attends Religious Services:   . Active Member of Clubs or Organizations:   . Attends Marland Kitchen Meetings:   Banker Marital Status:   Intimate Partner Violence:   . Fear of Current or Ex-Partner:   . Emotionally Abused:   .  Physically Abused:   . Sexually Abused:       Review of Systems  Constitutional: Negative for chills, fatigue and unexpected weight change.  HENT: Negative for congestion, rhinorrhea, sneezing and sore throat.   Eyes: Negative for photophobia, pain and redness.  Respiratory: Negative for cough, chest tightness and shortness of breath.   Cardiovascular: Negative for chest pain and palpitations.  Gastrointestinal: Negative for abdominal pain, constipation, diarrhea, nausea and vomiting.  Endocrine: Negative.   Genitourinary: Negative for dysuria and frequency.  Musculoskeletal: Negative for arthralgias, back pain, joint swelling and neck pain.   Skin: Negative for rash.  Allergic/Immunologic: Negative.   Neurological: Negative for tremors and numbness.  Hematological: Negative for adenopathy. Does not bruise/bleed easily.  Psychiatric/Behavioral: Negative for behavioral problems and sleep disturbance. The patient is not nervous/anxious.     Vital Signs: BP 99/60   Pulse 87   Temp (!) 97.3 F (36.3 C)   Resp 16   Ht 5\' 6"  (1.676 m)   Wt 147 lb (66.7 kg)   SpO2 100%   BMI 23.73 kg/m    Physical Exam Vitals and nursing note reviewed.  Constitutional:      General: She is not in acute distress.    Appearance: She is well-developed. She is not diaphoretic.  HENT:     Head: Normocephalic and atraumatic.     Mouth/Throat:     Pharynx: No oropharyngeal exudate.  Eyes:     Pupils: Pupils are equal, round, and reactive to light.  Neck:     Thyroid: No thyromegaly.     Vascular: No JVD.     Trachea: No tracheal deviation.  Cardiovascular:     Rate and Rhythm: Normal rate and regular rhythm.     Heart sounds: Normal heart sounds. No murmur. No friction rub. No gallop.   Pulmonary:     Effort: Pulmonary effort is normal. No respiratory distress.     Breath sounds: Normal breath sounds. No wheezing or rales.  Chest:     Chest wall: No tenderness.  Abdominal:     Palpations: Abdomen is soft.     Tenderness: There is no abdominal tenderness. There is no guarding.  Musculoskeletal:        General: Normal range of motion.     Cervical back: Normal range of motion and neck supple.  Lymphadenopathy:     Cervical: No cervical adenopathy.  Skin:    General: Skin is warm and dry.  Neurological:     Mental Status: She is alert and oriented to person, place, and time.     Cranial Nerves: No cranial nerve deficit.  Psychiatric:        Behavior: Behavior normal.        Thought Content: Thought content normal.        Judgment: Judgment normal.     Assessment/Plan: 1. Attention and concentration deficit Refilled  Controlled medications today. Reviewed risks and possible side effects associated with taking Stimulants. Combination of these drugs with other psychotropic medications could cause dizziness and drowsiness. Pt needs to Monitor symptoms and exercise caution in driving and operating heavy machinery to avoid damages to oneself, to others and to the surroundings. Patient verbalized understanding in this matter. Dependence and abuse for these drugs will be monitored closely. A Controlled substance policy and procedure is on file which allows [facility] to order a urine drug screen test at any visit. Patient understands and agrees with the plan.. - amphetamine-dextroamphetamine (ADDERALL) 30 MG  tablet; Take 1 tablet by mouth 2 (two) times daily.  Dispense: 60 tablet; Refill: 0 - amphetamine-dextroamphetamine (ADDERALL) 30 MG tablet; Take 1 tablet by mouth 2 (two) times daily.  Dispense: 60 tablet; Refill: 0   2. Generalized anxiety disorder Restart Lexapro, follow up in two months.  - escitalopram (LEXAPRO) 10 MG tablet; Take 1 tablet (10 mg total) by mouth daily.  Dispense: 30 tablet; Refill: 1  General Counseling: Megan Floyd verbalizes understanding of the findings of todays visit and agrees with plan of treatment. I have discussed any further diagnostic evaluation that may be needed or ordered today. We also reviewed her medications today. she has been encouraged to call the office with any questions or concerns that should arise related to todays visit.    No orders of the defined types were placed in this encounter.   Meds ordered this encounter  Medications  . amphetamine-dextroamphetamine (ADDERALL) 30 MG tablet    Sig: Take 1 tablet by mouth 2 (two) times daily.    Dispense:  60 tablet    Refill:  0  . amphetamine-dextroamphetamine (ADDERALL) 30 MG tablet    Sig: Take 1 tablet by mouth 2 (two) times daily.    Dispense:  60 tablet    Refill:  0    Do not fill before 02/13/20     Time spent: 30 Minutes   This patient was seen by Blima Ledger AGNP-C in Collaboration with Dr Lyndon Code as a part of collaborative care agreement     Johnna Acosta AGNP-C Internal medicine

## 2020-03-05 ENCOUNTER — Telehealth: Payer: Self-pay

## 2020-03-05 NOTE — Telephone Encounter (Signed)
Confirmed appointment on 07/06/2021and screened for covid. [facility] 

## 2020-03-10 ENCOUNTER — Encounter: Payer: Self-pay | Admitting: Adult Health

## 2020-03-10 ENCOUNTER — Other Ambulatory Visit: Payer: Self-pay

## 2020-03-10 ENCOUNTER — Ambulatory Visit: Payer: Self-pay | Admitting: Adult Health

## 2020-03-10 VITALS — BP 123/68 | HR 81 | Temp 97.6°F | Resp 16 | Ht 66.0 in | Wt 146.6 lb

## 2020-03-10 DIAGNOSIS — F411 Generalized anxiety disorder: Secondary | ICD-10-CM

## 2020-03-10 DIAGNOSIS — R41840 Attention and concentration deficit: Secondary | ICD-10-CM

## 2020-03-10 MED ORDER — AMPHETAMINE-DEXTROAMPHETAMINE 30 MG PO TABS: 30.0000 mg | ORAL_TABLET | Freq: Every day | ORAL | 0 refills | Status: DC

## 2020-03-10 MED ORDER — ESCITALOPRAM OXALATE 10 MG PO TABS: 10.0000 mg | ORAL_TABLET | Freq: Every day | ORAL | 3 refills | Status: DC

## 2020-03-10 MED ORDER — AMPHETAMINE-DEXTROAMPHETAMINE 30 MG PO TABS: 30.0000 mg | ORAL_TABLET | Freq: Two times a day (BID) | ORAL | 0 refills | Status: DC

## 2020-03-10 NOTE — Progress Notes (Signed)
[facility] [address] [place], [place] [address]  Internal MEDICINE  Office Visit Note  Patient Name: Megan Floyd  [id number]  [id number]  Date of Service: 03/10/2020  Chief Complaint  Patient presents with  . Follow-up  . Depression    HPI  Pt is here for follow up on depression and ADD. Overall she is doing well.  Her ADD symptoms are well controlled with her aderall.  She denies any new or concerning symptoms. Her depression is also well managed with lexapro and she denies any pain or need.     Current Medication: Outpatient Encounter Medications as of 03/10/2020  Medication Sig  . amphetamine-dextroamphetamine (ADDERALL) 30 MG tablet Take 1 tablet by mouth 2 (two) times daily.  Marland Kitchen amphetamine-dextroamphetamine (ADDERALL) 30 MG tablet Take 1 tablet by mouth 2 (two) times daily.  Marland Kitchen amphetamine-dextroamphetamine (ADDERALL) 30 MG tablet Take 1 tablet by mouth 2 (two) times daily.  Marland Kitchen escitalopram (LEXAPRO) 10 MG tablet Take 1 tablet (10 mg total) by mouth daily.  Marland Kitchen etodolac (LODINE) 200 MG capsule Take 1 capsule (200 mg total) by mouth 2 (two) times daily.  Marland Kitchen glycopyrrolate (ROBINUL) 1 MG tablet Take 1 tablet (1 mg total) by mouth 3 (three) times daily.  . norethindrone-ethinyl estradiol (JUNEL 1/20) 1-20 MG-MCG tablet TAKE BY MOUTH AS DIRECTED  . traMADol (ULTRAM) 50 MG tablet Take 1 tablet (50 mg total) by mouth every 6 (six) hours as needed.  . [DISCONTINUED] amphetamine-dextroamphetamine (ADDERALL) 30 MG tablet Take 1 tablet by mouth 2 (two) times daily.  . [DISCONTINUED] escitalopram (LEXAPRO) 10 MG tablet Take 1 tablet (10 mg total) by mouth daily.  Melene Muller ON 04/09/2020] amphetamine-dextroamphetamine (ADDERALL) 30 MG tablet Take 1 tablet by mouth daily.  Melene Muller ON 05/09/2020] amphetamine-dextroamphetamine (ADDERALL) 30 MG tablet Take 1 tablet by mouth daily.   No facility-administered encounter medications on file as of 03/10/2020.    Surgical History: Past  Surgical History:  Procedure Laterality Date  . TONSILLECTOMY      Medical History: Past Medical History:  Diagnosis Date  . ADHD   . Anxiety   . Depression     Family History: History reviewed. No pertinent family history.  Social History   Socioeconomic History  . Marital status: Single    Spouse name: Not on file  . Number of children: Not on file  . Years of education: Not on file  . Highest education level: Not on file  Occupational History  . Not on file  Tobacco Use  . Smoking status: Never Smoker  . Smokeless tobacco: Never Used  Vaping Use  . Vaping Use: Never used  Substance and Sexual Activity  . Alcohol use: Yes    Comment: ocassionally  . Drug use: No  . Sexual activity: Not on file  Other Topics Concern  . Not on file  Social History Narrative  . Not on file   Social Determinants of Health   Financial Resource Strain:   . Difficulty of Paying Living Expenses:   Food Insecurity:   . Worried About Programme researcher, broadcasting/film/video in the Last Year:   . Barista in the Last Year:   Transportation Needs:   . Freight forwarder (Medical):   Marland Kitchen Lack of Transportation (Non-Medical):   Physical Activity:   . Days of Exercise per Week:   . Minutes of Exercise per Session:   Stress:   . Feeling of Stress :   Social Connections:   .  Frequency of Communication with Friends and Family:   . Frequency of Social Gatherings with Friends and Family:   . Attends Religious Services:   . Active Member of Clubs or Organizations:   . Attends Banker Meetings:   Marland Kitchen Marital Status:   Intimate Partner Violence:   . Fear of Current or Ex-Partner:   . Emotionally Abused:   Marland Kitchen Physically Abused:   . Sexually Abused:       Review of Systems  Constitutional: Negative for chills, fatigue and unexpected weight change.  HENT: Negative for congestion, rhinorrhea, sneezing and sore throat.   Eyes: Negative for photophobia, pain and redness.  Respiratory:  Negative for cough, chest tightness and shortness of breath.   Cardiovascular: Negative for chest pain and palpitations.  Gastrointestinal: Negative for abdominal pain, constipation, diarrhea, nausea and vomiting.  Endocrine: Negative.   Genitourinary: Negative for dysuria and frequency.  Musculoskeletal: Negative for arthralgias, back pain, joint swelling and neck pain.  Skin: Negative for rash.  Allergic/Immunologic: Negative.   Neurological: Negative for tremors and numbness.  Hematological: Negative for adenopathy. Does not bruise/bleed easily.  Psychiatric/Behavioral: Negative for behavioral problems and sleep disturbance. The patient is not nervous/anxious.     Vital Signs: BP 123/68   Pulse 81   Temp 97.6 F (36.4 C)   Resp 16   Ht 5\' 6"  (1.676 m)   Wt 146 lb 9.6 oz (66.5 kg)   SpO2 99%   BMI 23.66 kg/m    Physical Exam Vitals and nursing note reviewed.  Constitutional:      General: She is not in acute distress.    Appearance: She is well-developed. She is not diaphoretic.  HENT:     Head: Normocephalic and atraumatic.     Mouth/Throat:     Pharynx: No oropharyngeal exudate.  Eyes:     Pupils: Pupils are equal, round, and reactive to light.  Neck:     Thyroid: No thyromegaly.     Vascular: No JVD.     Trachea: No tracheal deviation.  Cardiovascular:     Rate and Rhythm: Normal rate and regular rhythm.     Heart sounds: Normal heart sounds. No murmur heard.  No friction rub. No gallop.   Pulmonary:     Effort: Pulmonary effort is normal. No respiratory distress.     Breath sounds: Normal breath sounds. No wheezing or rales.  Chest:     Chest wall: No tenderness.  Abdominal:     Palpations: Abdomen is soft.     Tenderness: There is no abdominal tenderness. There is no guarding.  Musculoskeletal:        General: Normal range of motion.     Cervical back: Normal range of motion and neck supple.  Lymphadenopathy:     Cervical: No cervical adenopathy.   Skin:    General: Skin is warm and dry.  Neurological:     Mental Status: She is alert and oriented to person, place, and time.     Cranial Nerves: No cranial nerve deficit.  Psychiatric:        Behavior: Behavior normal.        Thought Content: Thought content normal.        Judgment: Judgment normal.    Assessment/Plan: 1. Attention and concentration deficit Refilled Controlled medications today. Reviewed risks and possible side effects associated with taking Stimulants. Combination of these drugs with other psychotropic medications could cause dizziness and drowsiness. Pt needs to Monitor symptoms and exercise caution  in driving and operating heavy machinery to avoid damages to oneself, to others and to the surroundings. Patient verbalized understanding in this matter. Dependence and abuse for these drugs will be monitored closely. A Controlled substance policy and procedure is on file which allows [facility] to order a urine drug screen test at any visit. Patient understands and agrees with the plan.. - amphetamine-dextroamphetamine (ADDERALL) 30 MG tablet; Take 1 tablet by mouth 2 (two) times daily.  Dispense: 60 tablet; Refill: 0 - amphetamine-dextroamphetamine (ADDERALL) 30 MG tablet; Take 1 tablet by mouth daily.  Dispense: 60 tablet; Refill: 0 - amphetamine-dextroamphetamine (ADDERALL) 30 MG tablet; Take 1 tablet by mouth daily.  Dispense: 30 tablet; Refill: 0  2. Generalized anxiety disorder Continue with lexapro as directed.  - escitalopram (LEXAPRO) 10 MG tablet; Take 1 tablet (10 mg total) by mouth daily.  Dispense: 30 tablet; Refill: 3  General Counseling: Megan Floyd verbalizes understanding of the findings of todays visit and agrees with plan of treatment. I have discussed any further diagnostic evaluation that may be needed or ordered today. We also reviewed her medications today. she has been encouraged to call the office with any questions or concerns that should  arise related to todays visit.    No orders of the defined types were placed in this encounter.   Meds ordered this encounter  Medications  . escitalopram (LEXAPRO) 10 MG tablet    Sig: Take 1 tablet (10 mg total) by mouth daily.    Dispense:  30 tablet    Refill:  3  . amphetamine-dextroamphetamine (ADDERALL) 30 MG tablet    Sig: Take 1 tablet by mouth 2 (two) times daily.    Dispense:  60 tablet    Refill:  0  . amphetamine-dextroamphetamine (ADDERALL) 30 MG tablet    Sig: Take 1 tablet by mouth daily.    Dispense:  60 tablet    Refill:  0    Do not fill before 04/09/2020  . amphetamine-dextroamphetamine (ADDERALL) 30 MG tablet    Sig: Take 1 tablet by mouth daily.    Dispense:  30 tablet    Refill:  0    Do not fill before 05/09/2020    Time spent: 30 Minutes   This patient was seen by Blima Ledger AGNP-C in Collaboration with Dr Lyndon Code as a part of collaborative care agreement     Johnna Acosta AGNP-C Internal medicine

## 2020-06-10 ENCOUNTER — Ambulatory Visit (INDEPENDENT_AMBULATORY_CARE_PROVIDER_SITE_OTHER): Payer: Self-pay | Admitting: Hospice and Palliative Medicine

## 2020-06-10 ENCOUNTER — Other Ambulatory Visit: Payer: Self-pay

## 2020-06-10 ENCOUNTER — Encounter: Payer: Self-pay | Admitting: Hospice and Palliative Medicine

## 2020-06-10 ENCOUNTER — Telehealth: Payer: Self-pay

## 2020-06-10 DIAGNOSIS — R41840 Attention and concentration deficit: Secondary | ICD-10-CM

## 2020-06-10 DIAGNOSIS — Z79899 Other long term (current) drug therapy: Secondary | ICD-10-CM

## 2020-06-10 DIAGNOSIS — F411 Generalized anxiety disorder: Secondary | ICD-10-CM

## 2020-06-10 MED ORDER — AMPHETAMINE-DEXTROAMPHETAMINE 10 MG PO TABS: 10.0000 mg | ORAL_TABLET | Freq: Two times a day (BID) | ORAL | 0 refills | Status: DC

## 2020-06-10 NOTE — Telephone Encounter (Signed)
Pt declines to make follow up appt. She stated she is going to a new PCP

## 2020-06-10 NOTE — Progress Notes (Signed)
[facility] [address] [place], [place] [address]  Internal MEDICINE  Office Visit Note  Patient Name: Megan Floyd  [id number]  [id number]  Date of Service: 06/13/2020  Chief Complaint  Patient presents with  . Follow-up    needs form for controlled meds, refill request  . Depression  . Quality Metric Gaps    pap, hepC    HPI Patient is here for routine follow-up She is here requesting refills of her Adderall She does not feel that her Lexapro is helping with her anxiety and depression, not wanting to try a different medication at this time She feels that she requires her adderall everyday and does take it twice a day Occasionally she does not take it on the weekends, but usually does as she does work most weekends  She has not had a physical exam, labs have not been collected since 2018--not wanting to have them done  Current Medication: Outpatient Encounter Medications as of 06/10/2020  Medication Sig  . escitalopram (LEXAPRO) 10 MG tablet Take 1 tablet (10 mg total) by mouth daily.  . [DISCONTINUED] amphetamine-dextroamphetamine (ADDERALL) 30 MG tablet Take 1 tablet by mouth 2 (two) times daily.  . [DISCONTINUED] amphetamine-dextroamphetamine (ADDERALL) 30 MG tablet Take 1 tablet by mouth 2 (two) times daily.  . [DISCONTINUED] amphetamine-dextroamphetamine (ADDERALL) 30 MG tablet Take 1 tablet by mouth 2 (two) times daily.  . [DISCONTINUED] amphetamine-dextroamphetamine (ADDERALL) 30 MG tablet Take 1 tablet by mouth daily.  . [DISCONTINUED] amphetamine-dextroamphetamine (ADDERALL) 30 MG tablet Take 1 tablet by mouth daily.  Marland Kitchen amphetamine-dextroamphetamine (ADDERALL) 10 MG tablet Take 1 tablet (10 mg total) by mouth 2 (two) times daily.  . [DISCONTINUED] etodolac (LODINE) 200 MG capsule Take 1 capsule (200 mg total) by mouth 2 (two) times daily. (Patient not taking: Reported on 06/10/2020)  . [DISCONTINUED] glycopyrrolate (ROBINUL) 1 MG tablet Take 1 tablet  (1 mg total) by mouth 3 (three) times daily. (Patient not taking: Reported on 06/10/2020)  . [DISCONTINUED] norethindrone-ethinyl estradiol (JUNEL 1/20) 1-20 MG-MCG tablet TAKE BY MOUTH AS DIRECTED (Patient not taking: Reported on 06/10/2020)  . [DISCONTINUED] traMADol (ULTRAM) 50 MG tablet Take 1 tablet (50 mg total) by mouth every 6 (six) hours as needed. (Patient not taking: Reported on 06/10/2020)   No facility-administered encounter medications on file as of 06/10/2020.    Surgical History: Past Surgical History:  Procedure Laterality Date  . TONSILLECTOMY      Medical History: Past Medical History:  Diagnosis Date  . ADHD   . Anxiety   . Depression     Family History: History reviewed. No pertinent family history.  Social History   Socioeconomic History  . Marital status: Single    Spouse name: Not on file  . Number of children: Not on file  . Years of education: Not on file  . Highest education level: Not on file  Occupational History  . Not on file  Tobacco Use  . Smoking status: Never Smoker  . Smokeless tobacco: Never Used  Vaping Use  . Vaping Use: Never used  Substance and Sexual Activity  . Alcohol use: Yes    Comment: ocassionally  . Drug use: No  . Sexual activity: Not on file  Other Topics Concern  . Not on file  Social History Narrative  . Not on file   Social Determinants of Health   Financial Resource Strain:   . Difficulty of Paying Living Expenses: Not on file  Food Insecurity:   . Worried  About Running Out of Food in the Last Year: Not on file  . Ran Out of Food in the Last Year: Not on file  Transportation Needs:   . Lack of Transportation (Medical): Not on file  . Lack of Transportation (Non-Medical): Not on file  Physical Activity:   . Days of Exercise per Week: Not on file  . Minutes of Exercise per Session: Not on file  Stress:   . Feeling of Stress : Not on file  Social Connections:   . Frequency of Communication with Friends  and Family: Not on file  . Frequency of Social Gatherings with Friends and Family: Not on file  . Attends Religious Services: Not on file  . Active Member of Clubs or Organizations: Not on file  . Attends Banker Meetings: Not on file  . Marital Status: Not on file  Intimate Partner Violence:   . Fear of Current or Ex-Partner: Not on file  . Emotionally Abused: Not on file  . Physically Abused: Not on file  . Sexually Abused: Not on file   Review of Systems  Constitutional: Negative for chills, diaphoresis and fatigue.  HENT: Negative for ear pain, postnasal drip and sinus pressure.   Eyes: Negative for photophobia, discharge, redness, itching and visual disturbance.  Respiratory: Negative for cough, shortness of breath and wheezing.   Cardiovascular: Negative for chest pain, palpitations and leg swelling.  Gastrointestinal: Negative for abdominal pain, constipation, diarrhea, nausea and vomiting.  Genitourinary: Negative for dysuria and flank pain.  Musculoskeletal: Negative for arthralgias, back pain, gait problem and neck pain.  Skin: Negative for color change.  Allergic/Immunologic: Negative for environmental allergies and food allergies.  Neurological: Negative for dizziness and headaches.  Hematological: Does not bruise/bleed easily.  Psychiatric/Behavioral: Negative for agitation, behavioral problems (depression) and hallucinations.    Vital Signs: BP 90/66   Pulse 88   Temp 97.6 F (36.4 C)   Resp 16   Ht 5\' 6"  (1.676 m)   Wt 154 lb 9.6 oz (70.1 kg)   SpO2 99%   BMI 24.95 kg/m    Physical Exam Vitals reviewed.  Constitutional:      Appearance: Normal appearance. She is normal weight.  Cardiovascular:     Rate and Rhythm: Normal rate and regular rhythm.     Pulses: Normal pulses.     Heart sounds: Normal heart sounds.  Pulmonary:     Effort: Pulmonary effort is normal.     Breath sounds: Normal breath sounds.  Neurological:     General: No  focal deficit present.     Mental Status: She is alert and oriented to person, place, and time. Mental status is at baseline.  Psychiatric:        Mood and Affect: Mood normal.        Behavior: Behavior normal.        Thought Content: Thought content normal.     PDMP reviewed, sedative score 20, narcotic score 50, stimulant score 210, overdose risk 190   Assessment/Plan: 1. Attention and concentration deficit Decreased dose to 10 mg BID, discussed the need to been seen by a specialist to properly diagnose and prescribe higher doses of Adderall Contact information given for her to reach out to local specialist to assist her with getting proper treatment for ADHD Discussed new controlled substance policy - amphetamine-dextroamphetamine (ADDERALL) 10 MG tablet; Take 1 tablet (10 mg total) by mouth 2 (two) times daily.  Dispense: 30 tablet; Refill: 0  2. Generalized  anxiety disorder Feels that Lexapro is not helping with her anxiety level, discussed other options, at this visit she is not interested in changing therapies   General Counseling: Zaire verbalizes understanding of the findings of todays visit and agrees with plan of treatment. I have discussed any further diagnostic evaluation that may be needed or ordered today. We also reviewed her medications today. she has been encouraged to call the office with any questions or concerns that should arise related to todays visit.   Meds ordered this encounter  Medications  . amphetamine-dextroamphetamine (ADDERALL) 10 MG tablet    Sig: Take 1 tablet (10 mg total) by mouth 2 (two) times daily.    Dispense:  30 tablet    Refill:  0    Time spent: 30 Minutes Time spent includes review of chart, medications, test results and follow-up plan with the patient.  This patient was seen by Leeanne Deed AGNP-C in Collaboration with Dr Lyndon Code as a part of collaborative care agreement     Lubertha Basque. Mackenze Grandison AGNP-C Internal medicine

## 2020-06-13 ENCOUNTER — Encounter: Payer: Self-pay | Admitting: Hospice and Palliative Medicine

## 2020-10-08 ENCOUNTER — Other Ambulatory Visit: Payer: Self-pay

## 2020-10-08 ENCOUNTER — Ambulatory Visit (INDEPENDENT_AMBULATORY_CARE_PROVIDER_SITE_OTHER): Payer: 59 | Admitting: Obstetrics

## 2020-10-08 ENCOUNTER — Other Ambulatory Visit (HOSPITAL_COMMUNITY)
Admission: RE | Admit: 2020-10-08 | Discharge: 2020-10-08 | Disposition: A | Discharge status: Home / Self Care | Payer: 59 | Source: Ambulatory Visit | Attending: Obstetrics | Admitting: Obstetrics

## 2020-10-08 ENCOUNTER — Encounter: Payer: Self-pay | Admitting: Obstetrics

## 2020-10-08 VITALS — BP 100/80 | Ht 66.0 in | Wt 153.0 lb

## 2020-10-08 DIAGNOSIS — Z113 Encounter for screening for infections with a predominantly sexual mode of transmission: Secondary | ICD-10-CM

## 2020-10-08 DIAGNOSIS — Z01419 Encounter for gynecological examination (general) (routine) without abnormal findings: Secondary | ICD-10-CM | POA: Diagnosis not present

## 2020-10-08 DIAGNOSIS — Z124 Encounter for screening for malignant neoplasm of cervix: Secondary | ICD-10-CM | POA: Insufficient documentation

## 2020-10-08 DIAGNOSIS — R5383 Other fatigue: Secondary | ICD-10-CM | POA: Diagnosis not present

## 2020-10-08 DIAGNOSIS — L680 Hirsutism: Secondary | ICD-10-CM

## 2020-10-08 NOTE — Progress Notes (Signed)
Gynecology Annual Exam  PCP: Carlean Jews, NP  Chief Complaint:  Chief Complaint  Patient presents with  . Gynecologic Exam    Chin hair, fatigue x several months    History of Present Illness:  [name] is a 30 y.o. [id number] who LMP was Patient's last menstrual period was 09/29/2020 (approximate)., presents today for her annual examination.  Her menses are regular every 28 days, lasting 5 day(s).  Dysmenorrhea none. She does not have intermenstrual bleeding. She is here for a "catch up " Annual Gyn physical. She has also noticed an increase in facial hair development over the last years and would like to be referred for evaluation for this.  She is single partner, contraception - coitus interruptus.  Last Pap: years ago  Results were: no abnormalities  She has not had a pap smear in many years Hx of STDs: none  There is no FH of breast cancer. There is no FH of ovarian cancer. The patient does not do self-breast exams. She has breast implants. Tobacco use: The patient denies current or previous tobacco use. She smokes marijuana on a regular basis. Alcohol use: none Exercise: very active- works out several times weekly    The patient wears seatbelts: yes.   The patient reports that domestic violence in her life is absent.   Past Medical History:  Diagnosis Date  . ADHD   . Anxiety   . Depression     Past Surgical History:  Procedure Laterality Date  . TONSILLECTOMY      Prior to Admission medications   Not on File    No Known Allergies  Gynecologic History: Patient's last menstrual period was 09/29/2020 (approximate). History of abnormal pap smear: No History of STI: No   Obstetric History: [id number]  Social History   Socioeconomic History  . Marital status: Single    Spouse name: Not on file  . Number of children: Not on file  . Years of education: Not on file  . Highest education level: Not on file  Occupational History  . Not on file   Tobacco Use  . Smoking status: Never Smoker  . Smokeless tobacco: Never Used  Vaping Use  . Vaping Use: Never used  Substance and Sexual Activity  . Alcohol use: Yes    Comment: ocassionally  . Drug use: No  . Sexual activity: Yes    Partners: Male    Birth control/protection: None  Other Topics Concern  . Not on file  Social History Narrative  . Not on file   Social Determinants of Health   Financial Resource Strain: Not on file  Food Insecurity: Not on file  Transportation Needs: Not on file  Physical Activity: Not on file  Stress: Not on file  Social Connections: Not on file  Intimate Partner Violence: Not on file    History reviewed. No pertinent family history.  ROS   Physical Exam BP 100/80   Ht 5\' 6"  (1.676 m)   Wt 153 lb (69.4 kg)   LMP 09/29/2020 (Approximate)   BMI 24.69 kg/m    OBGyn Exam  Female chaperone present for pelvic and breast  portions of the physical exam  Results: AUDIT Questionnaire (screen for alcoholism): NA    Assessment: 30 y.o. [id number] female here for routine annual gynecologic examination  Plan: Problem List Items Addressed This Visit      Other   Other fatigue   Relevant Orders   CBC   TSH + free  T4    Other Visit Diagnoses    Women's annual routine gynecological examination    -  Primary   Relevant Orders   HEP, RPR, HIV Panel   CBC   TSH + free T4   Cervical cancer screening       Relevant Orders   Cytology - PAP   Routine screening for STI (sexually transmitted infection)       Relevant Orders   Cytology - PAP   HEP, RPR, HIV Panel   Fatigue, unspecified type          Screening: -- Blood pressure screen normal -- Weight screening: normal -- Depression screening negative (PHQ-9) -- Nutrition: normal -- cholesterol screening: not due for screening -- osteoporosis screening: not due -- tobacco screening: not using -- alcohol screening: AUDIT questionnaire indicates low-risk usage. -- family history  of breast cancer screening: done. not at high risk. -- no evidence of domestic violence or intimate partner violence. -- STD screening: gonorrhea/chlamydia NAAT collected -- pap smear collected per ASCCP guidelines -- flu vaccine declines -- HPV vaccination series: not eligilbe   Encouraged her to consider more formal contraception. She has stopped all of her prescriptive medications, including her Adderall, Lexapro.She admits to being "foggy" and having difficulty concentrating. Advised her to start on a daily vitamin with folic acid, and reminded her of the lower effectiveness of "coitus interruptus". Will refer her to one of the MDs as a follow up to investigate her newly developed hirsutism.  Pa smear retrieve, as well as STI screening , including HIV, etc.  Mirna Mires, CNM  10/08/2020 3:09 PM   10/08/2020 3:05 PM

## 2020-10-09 LAB — HEP, RPR, HIV PANEL
HIV Screen 4th Generation wRfx: NONREACTIVE
Hepatitis B Surface Ag: NEGATIVE
RPR Ser Ql: NONREACTIVE

## 2020-10-09 LAB — CBC
Hematocrit: 42.1 % (ref 34.0–46.6)
Hemoglobin: 13.8 g/dL (ref 11.1–15.9)
MCH: 29.9 pg (ref 26.6–33.0)
MCHC: 32.8 g/dL (ref 31.5–35.7)
MCV: 91 fL (ref 79–97)
Platelets: 297 10*3/uL (ref 150–450)
RBC: 4.62 x10E6/uL (ref 3.77–5.28)
RDW: 12.8 % (ref 11.7–15.4)
WBC: 5.6 10*3/uL (ref 3.4–10.8)

## 2020-10-09 LAB — TSH+FREE T4
Free T4: 1.28 ng/dL (ref 0.82–1.77)
TSH: 2.55 u[IU]/mL (ref 0.450–4.500)

## 2020-10-11 ENCOUNTER — Encounter: Payer: Self-pay | Admitting: Obstetrics

## 2020-10-13 ENCOUNTER — Encounter: Payer: Self-pay | Admitting: Obstetrics

## 2020-10-13 LAB — CYTOLOGY - PAP
Chlamydia: NEGATIVE
Comment: NEGATIVE
Comment: NEGATIVE
Comment: NORMAL
Diagnosis: NEGATIVE
Neisseria Gonorrhea: NEGATIVE
Trichomonas: NEGATIVE

## 2020-10-13 NOTE — Progress Notes (Signed)
Reviewed her pap smear result as well as her STI screening. All those labs negative. She does not have My Chart set up so left her a VM. She is seeing Dr. Bonney Aid soon to address hirsutism concerns. Mirna Mires, CNM  10/13/2020 12:34 PM

## 2020-10-20 ENCOUNTER — Ambulatory Visit: Payer: 59 | Admitting: Obstetrics and Gynecology

## 2021-01-29 ENCOUNTER — Emergency Department: Admission: EM | Admit: 2021-01-29 | Discharge: 2021-01-29 | Discharge status: Against Medical Advice | Payer: 59

## 2021-01-29 NOTE — ED Notes (Signed)
Pt LWBS prior to triage.  Pt unable to be found in the facility.

## 2022-10-03 ENCOUNTER — Other Ambulatory Visit: Payer: Self-pay | Admitting: Family

## 2022-10-05 LAB — TOXASSURE SELECT 16, UR

## 2022-11-09 ENCOUNTER — Other Ambulatory Visit: Payer: Self-pay

## 2022-11-09 DIAGNOSIS — R41840 Attention and concentration deficit: Secondary | ICD-10-CM

## 2022-11-09 MED ORDER — AMPHETAMINE-DEXTROAMPHETAMINE 30 MG PO TABS: 30.0000 mg | ORAL_TABLET | Freq: Every day | ORAL | 0 refills | Status: DC

## 2022-11-09 MED ORDER — AMPHETAMINE-DEXTROAMPHETAMINE 15 MG PO TABS: 15.0000 mg | ORAL_TABLET | Freq: Every day | ORAL | 0 refills | Status: DC

## 2022-12-23 ENCOUNTER — Other Ambulatory Visit: Payer: Self-pay

## 2022-12-23 DIAGNOSIS — R41840 Attention and concentration deficit: Secondary | ICD-10-CM

## 2022-12-26 MED ORDER — AMPHETAMINE-DEXTROAMPHETAMINE 30 MG PO TABS: 30.0000 mg | ORAL_TABLET | Freq: Every day | ORAL | 0 refills | Status: DC

## 2022-12-26 MED ORDER — AMPHETAMINE-DEXTROAMPHETAMINE 15 MG PO TABS: 15.0000 mg | ORAL_TABLET | Freq: Every day | ORAL | 0 refills | Status: DC

## 2023-01-02 ENCOUNTER — Ambulatory Visit: Payer: No Typology Code available for payment source | Admitting: Family

## 2023-01-03 ENCOUNTER — Encounter: Payer: Self-pay | Admitting: Family

## 2023-01-03 ENCOUNTER — Ambulatory Visit: Payer: No Typology Code available for payment source | Admitting: Family

## 2023-01-03 VITALS — BP 122/64 | HR 101 | Ht 66.0 in | Wt 155.0 lb

## 2023-01-03 DIAGNOSIS — R41840 Attention and concentration deficit: Secondary | ICD-10-CM

## 2023-01-03 DIAGNOSIS — L680 Hirsutism: Secondary | ICD-10-CM

## 2023-01-03 DIAGNOSIS — R5383 Other fatigue: Secondary | ICD-10-CM

## 2023-01-03 MED ORDER — AMPHETAMINE-DEXTROAMPHETAMINE 15 MG PO TABS: 15.0000 mg | ORAL_TABLET | Freq: Every day | ORAL | 0 refills | Status: DC

## 2023-01-03 MED ORDER — TRETINOIN 0.1 % EX CREA: TOPICAL_CREAM | CUTANEOUS | 3 refills | Status: AC

## 2023-01-03 MED ORDER — SPIRONOLACTONE 100 MG PO TABS: 100.0000 mg | ORAL_TABLET | Freq: Every day | ORAL | 1 refills | Status: DC

## 2023-01-03 MED ORDER — AMPHETAMINE-DEXTROAMPHETAMINE 30 MG PO TABS: 30.0000 mg | ORAL_TABLET | Freq: Every day | ORAL | 0 refills | Status: DC

## 2023-01-04 ENCOUNTER — Encounter: Payer: Self-pay | Admitting: Family

## 2023-01-04 NOTE — Progress Notes (Signed)
Established Patient Office Visit  Subjective:  Patient ID: Megan Floyd, female    DOB: [birth year]  Age: 32 y.o. MRN: [id number]  Chief Complaint  Patient presents with   Follow-up    3 month follow up    Patient is here today for her 3 months follow up.  She has been feeling well since last appointment.   She does not have additional concerns to discuss today.  Labs are not due today. She needs refills.   I have reviewed her active problem list, medication list, allergies, notes from last encounter, lab results for her appointment today.      No other concerns at this time.   Past Medical History:  Diagnosis Date   ADHD    Anxiety    Depression     Past Surgical History:  Procedure Laterality Date   TONSILLECTOMY      Social History   Socioeconomic History   Marital status: Single    Spouse name: Not on file   Number of children: Not on file   Years of education: Not on file   Highest education level: Not on file  Occupational History   Not on file  Tobacco Use   Smoking status: Never   Smokeless tobacco: Never  Vaping Use   Vaping Use: Never used  Substance and Sexual Activity   Alcohol use: Yes    Comment: ocassionally   Drug use: No   Sexual activity: Yes    Partners: Male    Birth control/protection: None  Other Topics Concern   Not on file  Social History Narrative   Not on file   Social Determinants of Health   Financial Resource Strain: Not on file  Food Insecurity: Not on file  Transportation Needs: Not on file  Physical Activity: Not on file  Stress: Not on file  Social Connections: Not on file  Intimate Partner Violence: Not on file    History reviewed. No pertinent family history.  No Known Allergies  Review of Systems  All other systems reviewed and are negative.      Objective:   BP 122/64   Pulse (!) 101   Ht 5\' 6"  (1.676 m)   Wt 155 lb (70.3 kg)   SpO2 98%   BMI 25.02 kg/m   Vitals:   01/03/23 0906   BP: 122/64  Pulse: (!) 101  Height: 5\' 6"  (1.676 m)  Weight: 155 lb (70.3 kg)  SpO2: 98%  BMI (Calculated): 25.03    Physical Exam Vitals and nursing note reviewed.  Constitutional:      Appearance: Normal appearance. She is normal weight.  HENT:     Head: Normocephalic.  Eyes:     Pupils: Pupils are equal, round, and reactive to light.  Cardiovascular:     Rate and Rhythm: Normal rate.  Pulmonary:     Effort: Pulmonary effort is normal.  Neurological:     Mental Status: She is alert.      No results found for any visits on 01/03/23.  No results found for this or any previous visit (from the past 2160 hour(s)).     Assessment & Plan:   Problem List Items Addressed This Visit       Other   Attention and concentration deficit   Relevant Medications   amphetamine-dextroamphetamine (ADDERALL) 15 MG tablet   amphetamine-dextroamphetamine (ADDERALL) 30 MG tablet    Return in about 3 months (around 04/04/2023) for F/U.   Total time spent:  20 minutes  Miki Kins, FNP  01/03/2023

## 2023-01-24 ENCOUNTER — Other Ambulatory Visit: Payer: Self-pay | Admitting: Family

## 2023-01-24 DIAGNOSIS — R41840 Attention and concentration deficit: Secondary | ICD-10-CM

## 2023-01-25 MED ORDER — AMPHETAMINE-DEXTROAMPHETAMINE 15 MG PO TABS: 15.0000 mg | ORAL_TABLET | Freq: Every day | ORAL | 0 refills | Status: DC

## 2023-01-25 MED ORDER — AMPHETAMINE-DEXTROAMPHETAMINE 30 MG PO TABS
30.0000 mg | ORAL_TABLET | Freq: Every day | ORAL | 0 refills | Status: DC
Start: 2023-01-25 — End: 2023-02-23

## 2023-02-23 ENCOUNTER — Other Ambulatory Visit: Payer: Self-pay | Admitting: Family

## 2023-02-23 DIAGNOSIS — R41840 Attention and concentration deficit: Secondary | ICD-10-CM

## 2023-02-24 MED ORDER — AMPHETAMINE-DEXTROAMPHETAMINE 30 MG PO TABS
30.0000 mg | ORAL_TABLET | Freq: Every day | ORAL | 0 refills | Status: DC
Start: 2023-02-24 — End: 2023-04-25

## 2023-02-24 MED ORDER — AMPHETAMINE-DEXTROAMPHETAMINE 15 MG PO TABS: 15.0000 mg | ORAL_TABLET | Freq: Every day | ORAL | 0 refills | Status: DC

## 2023-04-04 ENCOUNTER — Encounter: Payer: Self-pay | Admitting: Family

## 2023-04-04 ENCOUNTER — Ambulatory Visit: Payer: No Typology Code available for payment source | Admitting: Family

## 2023-04-04 VITALS — BP 104/72 | HR 83 | Ht 66.0 in | Wt 144.6 lb

## 2023-04-04 DIAGNOSIS — Z79899 Other long term (current) drug therapy: Secondary | ICD-10-CM

## 2023-04-04 DIAGNOSIS — L680 Hirsutism: Secondary | ICD-10-CM | POA: Diagnosis not present

## 2023-04-04 DIAGNOSIS — R41840 Attention and concentration deficit: Secondary | ICD-10-CM

## 2023-04-04 MED ORDER — AMPHETAMINE-DEXTROAMPHETAMINE 15 MG PO TABS: 15.0000 mg | ORAL_TABLET | Freq: Every day | ORAL | 0 refills | Status: DC

## 2023-04-04 NOTE — Assessment & Plan Note (Signed)
Patient stable.  Well controlled with current therapy.   Continue current meds.  

## 2023-04-04 NOTE — Progress Notes (Signed)
Established Patient Office Visit  Subjective:  Patient ID: Megan Floyd, female    DOB: Aug 30, 1991  Age: 32 y.o. MRN: [id number]  Chief Complaint  Patient presents with   Follow-up    3 month follow up    Patient is here today for her 3 months follow up.  She has been feeling well since last appointment.   She does not have additional concerns to discuss today.  She needs refills - says the pharmacy told her it was too early to fill her 15mg  adderall for afternoons.   I have reviewed her active problem list, medication list, allergies, notes from last encounter, lab results for her appointment today.    No other concerns at this time.   Past Medical History:  Diagnosis Date   ADHD    Anxiety    Depression     Past Surgical History:  Procedure Laterality Date   TONSILLECTOMY      Social History   Socioeconomic History   Marital status: Single    Spouse name: Not on file   Number of children: Not on file   Years of education: Not on file   Highest education level: Not on file  Occupational History   Not on file  Tobacco Use   Smoking status: Never   Smokeless tobacco: Never  Vaping Use   Vaping status: Never Used  Substance and Sexual Activity   Alcohol use: Yes    Comment: ocassionally   Drug use: No   Sexual activity: Yes    Partners: Male    Birth control/protection: None  Other Topics Concern   Not on file  Social History Narrative   Not on file   Social Determinants of Health   Financial Resource Strain: Not on file  Food Insecurity: Not on file  Transportation Needs: Not on file  Physical Activity: Not on file  Stress: Not on file  Social Connections: Not on file  Intimate Partner Violence: Not on file    No family history on file.  No Known Allergies  Review of Systems  All other systems reviewed and are negative.      Objective:   BP 104/72   Pulse 83   Ht 5\' 6"  (1.676 m)   Wt 144 lb 9.6 oz (65.6 kg)   SpO2 97%   BMI  23.34 kg/m   Vitals:   04/04/23 0852  BP: 104/72  Pulse: 83  Height: 5\' 6"  (1.676 m)  Weight: 144 lb 9.6 oz (65.6 kg)  SpO2: 97%  BMI (Calculated): 23.35    Physical Exam Vitals and nursing note reviewed.  Constitutional:      Appearance: Normal appearance. She is normal weight.  HENT:     Head: Normocephalic.  Eyes:     Extraocular Movements: Extraocular movements intact.     Conjunctiva/sclera: Conjunctivae normal.     Pupils: Pupils are equal, round, and reactive to light.  Cardiovascular:     Rate and Rhythm: Normal rate.  Pulmonary:     Effort: Pulmonary effort is normal.  Musculoskeletal:        General: Normal range of motion.  Neurological:     General: No focal deficit present.     Mental Status: She is alert and oriented to person, place, and time. Mental status is at baseline.  Psychiatric:        Mood and Affect: Mood normal.        Behavior: Behavior normal.  Thought Content: Thought content normal.        Judgment: Judgment normal.      No results found for any visits on 04/04/23.  No results found for this or any previous visit (from the past 2160 hour(s)).     Assessment & Plan:   Problem List Items Addressed This Visit       Active Problems   Attention and concentration deficit - Primary    Patient stable.  Well controlled with current therapy.   Continue current meds.        Relevant Medications   amphetamine-dextroamphetamine (ADDERALL) 15 MG tablet   Encounter for long-term (current) use of medications    Patient stable.  Well controlled with current therapy.   Continue current meds.       Female hirsutism    Patient stable.  Well controlled with current therapy.   Continue current meds.        Return in about 3 months (around 07/05/2023).   Total time spent: 20 minutes  Miki Kins, FNP  04/04/2023   This document may have been prepared by [employer] Voice Recognition software and as such may include  unintentional dictation errors.

## 2023-04-25 ENCOUNTER — Other Ambulatory Visit: Payer: Self-pay | Admitting: Family

## 2023-04-25 DIAGNOSIS — R41840 Attention and concentration deficit: Secondary | ICD-10-CM

## 2023-04-27 ENCOUNTER — Other Ambulatory Visit: Payer: Self-pay | Admitting: Family

## 2023-04-27 DIAGNOSIS — R41840 Attention and concentration deficit: Secondary | ICD-10-CM

## 2023-04-27 MED ORDER — AMPHETAMINE-DEXTROAMPHETAMINE 30 MG PO TABS
30.0000 mg | ORAL_TABLET | Freq: Every day | ORAL | 0 refills | Status: DC
Start: 2023-04-27 — End: 2023-05-24

## 2023-04-27 MED ORDER — AMPHETAMINE-DEXTROAMPHETAMINE 15 MG PO TABS
15.0000 mg | ORAL_TABLET | Freq: Every day | ORAL | 0 refills | Status: DC
Start: 2023-04-27 — End: 2023-05-24

## 2023-05-24 ENCOUNTER — Other Ambulatory Visit: Payer: Self-pay | Admitting: Internal Medicine

## 2023-05-24 DIAGNOSIS — R41840 Attention and concentration deficit: Secondary | ICD-10-CM

## 2023-05-25 MED ORDER — AMPHETAMINE-DEXTROAMPHETAMINE 30 MG PO TABS
30.0000 mg | ORAL_TABLET | Freq: Every day | ORAL | 0 refills | Status: DC
Start: 2023-05-25 — End: 2023-07-04

## 2023-05-25 MED ORDER — AMPHETAMINE-DEXTROAMPHETAMINE 15 MG PO TABS: 15.0000 mg | ORAL_TABLET | Freq: Every day | ORAL | 0 refills | Status: DC

## 2023-06-07 ENCOUNTER — Other Ambulatory Visit: Payer: Self-pay | Admitting: Family

## 2023-06-07 DIAGNOSIS — R41840 Attention and concentration deficit: Secondary | ICD-10-CM

## 2023-06-28 ENCOUNTER — Other Ambulatory Visit: Payer: Self-pay | Admitting: Family

## 2023-07-04 ENCOUNTER — Other Ambulatory Visit: Payer: Self-pay | Admitting: Family

## 2023-07-04 ENCOUNTER — Ambulatory Visit (INDEPENDENT_AMBULATORY_CARE_PROVIDER_SITE_OTHER): Payer: Self-pay | Admitting: Family

## 2023-07-04 VITALS — BP 105/60 | HR 89 | Ht 66.0 in | Wt 145.4 lb

## 2023-07-04 DIAGNOSIS — F411 Generalized anxiety disorder: Secondary | ICD-10-CM | POA: Diagnosis not present

## 2023-07-04 DIAGNOSIS — R41840 Attention and concentration deficit: Secondary | ICD-10-CM

## 2023-07-04 DIAGNOSIS — L680 Hirsutism: Secondary | ICD-10-CM | POA: Diagnosis not present

## 2023-07-04 DIAGNOSIS — Z0130 Encounter for examination of blood pressure without abnormal findings: Secondary | ICD-10-CM

## 2023-07-04 DIAGNOSIS — Z79899 Other long term (current) drug therapy: Secondary | ICD-10-CM | POA: Diagnosis not present

## 2023-07-04 MED ORDER — AMPHETAMINE-DEXTROAMPHETAMINE 15 MG PO TABS
15.0000 mg | ORAL_TABLET | Freq: Every day | ORAL | 0 refills | Status: DC
Start: 2023-07-04 — End: 2023-08-17

## 2023-07-04 MED ORDER — AMPHETAMINE-DEXTROAMPHETAMINE 30 MG PO TABS: 30.0000 mg | ORAL_TABLET | Freq: Every day | ORAL | 0 refills | Status: DC

## 2023-07-04 NOTE — Progress Notes (Signed)
Established Patient Office Visit  Subjective:  Patient ID: Megan Floyd, female    DOB: [birth year]  Age: 32 y.o. MRN: [id number]  Chief Complaint  Patient presents with   Follow-up    3 mo    Patient is here today for her 2 months follow up.  She has been feeling well since last appointment.   She does not have additional concerns to discuss today.  Labs are not due today. She needs refills.   I have reviewed her active problem list, medication list, allergies, notes from last encounter, lab results for her appointment today.    No other concerns at this time.   Past Medical History:  Diagnosis Date   ADHD    Anxiety    Depression     Past Surgical History:  Procedure Laterality Date   TONSILLECTOMY      Social History   Socioeconomic History   Marital status: Single    Spouse name: Not on file   Number of children: Not on file   Years of education: Not on file   Highest education level: Not on file  Occupational History   Not on file  Tobacco Use   Smoking status: Never   Smokeless tobacco: Never  Vaping Use   Vaping status: Never Used  Substance and Sexual Activity   Alcohol use: Yes    Comment: ocassionally   Drug use: No   Sexual activity: Yes    Partners: Male    Birth control/protection: None  Other Topics Concern   Not on file  Social History Narrative   Not on file   Social Determinants of Health   Financial Resource Strain: Not on file  Food Insecurity: Not on file  Transportation Needs: Not on file  Physical Activity: Not on file  Stress: Not on file  Social Connections: Not on file  Intimate Partner Violence: Not on file    No family history on file.  No Known Allergies  Review of Systems  All other systems reviewed and are negative.      Objective:   BP 105/60   Pulse 89   Ht 5\' 6"  (1.676 m)   Wt 145 lb 6.4 oz (66 kg)   SpO2 97%   BMI 23.47 kg/m   Vitals:   07/04/23 0902  BP: 105/60  Pulse: 89  Height:  5\' 6"  (1.676 m)  Weight: 145 lb 6.4 oz (66 kg)  SpO2: 97%  BMI (Calculated): 23.48    Physical Exam Vitals and nursing note reviewed.  Constitutional:      Appearance: Normal appearance. She is normal weight.  HENT:     Head: Normocephalic.  Eyes:     Extraocular Movements: Extraocular movements intact.     Conjunctiva/sclera: Conjunctivae normal.     Pupils: Pupils are equal, round, and reactive to light.  Cardiovascular:     Rate and Rhythm: Normal rate.  Pulmonary:     Effort: Pulmonary effort is normal.  Musculoskeletal:        General: Normal range of motion.  Neurological:     General: No focal deficit present.     Mental Status: She is alert and oriented to person, place, and time. Mental status is at baseline.  Psychiatric:        Mood and Affect: Mood normal.        Behavior: Behavior normal.        Thought Content: Thought content normal.  Judgment: Judgment normal.      No results found for any visits on 07/04/23.  No results found for this or any previous visit (from the past 2160 hour(s)).     Assessment & Plan:   Problem List Items Addressed This Visit       Active Problems   Attention and concentration deficit - Primary    Patient stable.  Well controlled with current therapy.   Continue current meds.       Generalized anxiety disorder    Patient stable.  Well controlled with current therapy.   Continue current meds.       Encounter for long-term (current) use of medications    Patient stable.  Well controlled with current therapy.   Continue current meds.        Female hirsutism    Patient stable.  Well controlled with current therapy.   Continue current meds.        Return in about 3 months (around 10/04/2023) for F/U.   Total time spent: 20 minutes  Miki Kins, FNP  07/04/2023   This document may have been prepared by [facility] Voice Recognition software and as such may include unintentional dictation  errors.

## 2023-07-06 ENCOUNTER — Encounter: Payer: Self-pay | Admitting: Family

## 2023-07-06 NOTE — Assessment & Plan Note (Signed)
Patient stable.  Well controlled with current therapy.   Continue current meds.  

## 2023-08-01 ENCOUNTER — Other Ambulatory Visit: Payer: Self-pay | Admitting: Family

## 2023-08-01 DIAGNOSIS — R41840 Attention and concentration deficit: Secondary | ICD-10-CM

## 2023-08-02 MED ORDER — AMPHETAMINE-DEXTROAMPHETAMINE 30 MG PO TABS: 30.0000 mg | ORAL_TABLET | Freq: Every day | ORAL | 0 refills | Status: DC

## 2023-08-17 ENCOUNTER — Other Ambulatory Visit: Payer: Self-pay | Admitting: Family

## 2023-08-17 DIAGNOSIS — R41840 Attention and concentration deficit: Secondary | ICD-10-CM

## 2023-08-18 MED ORDER — AMPHETAMINE-DEXTROAMPHETAMINE 15 MG PO TABS: 15.0000 mg | ORAL_TABLET | Freq: Every day | ORAL | 0 refills | Status: DC

## 2023-09-05 ENCOUNTER — Other Ambulatory Visit: Payer: Self-pay | Admitting: Family

## 2023-09-05 DIAGNOSIS — R41840 Attention and concentration deficit: Secondary | ICD-10-CM

## 2023-09-05 MED ORDER — AMPHETAMINE-DEXTROAMPHETAMINE 30 MG PO TABS: 30.0000 mg | ORAL_TABLET | Freq: Every day | ORAL | 0 refills | Status: DC

## 2023-09-19 ENCOUNTER — Other Ambulatory Visit: Payer: Self-pay | Admitting: Family

## 2023-09-19 DIAGNOSIS — R41840 Attention and concentration deficit: Secondary | ICD-10-CM

## 2023-09-21 MED ORDER — AMPHETAMINE-DEXTROAMPHETAMINE 15 MG PO TABS: 15.0000 mg | ORAL_TABLET | Freq: Every day | ORAL | 0 refills | Status: DC

## 2023-10-08 ENCOUNTER — Other Ambulatory Visit: Payer: Self-pay | Admitting: Family

## 2023-10-08 DIAGNOSIS — R41840 Attention and concentration deficit: Secondary | ICD-10-CM

## 2023-10-09 MED ORDER — AMPHETAMINE-DEXTROAMPHETAMINE 30 MG PO TABS: 30.0000 mg | ORAL_TABLET | Freq: Every day | ORAL | 0 refills | Status: DC

## 2023-11-03 ENCOUNTER — Other Ambulatory Visit: Payer: Self-pay | Admitting: Family

## 2023-11-03 DIAGNOSIS — R41840 Attention and concentration deficit: Secondary | ICD-10-CM

## 2023-11-06 MED ORDER — AMPHETAMINE-DEXTROAMPHETAMINE 15 MG PO TABS: 15.0000 mg | ORAL_TABLET | Freq: Every day | ORAL | 0 refills | Status: DC

## 2023-11-15 ENCOUNTER — Other Ambulatory Visit: Payer: Self-pay | Admitting: Family

## 2023-11-15 DIAGNOSIS — R41840 Attention and concentration deficit: Secondary | ICD-10-CM

## 2023-11-15 MED ORDER — AMPHETAMINE-DEXTROAMPHETAMINE 30 MG PO TABS: 30.0000 mg | ORAL_TABLET | Freq: Every day | ORAL | 0 refills | Status: DC

## 2023-12-05 ENCOUNTER — Other Ambulatory Visit: Payer: Self-pay | Admitting: Family

## 2023-12-05 DIAGNOSIS — R41840 Attention and concentration deficit: Secondary | ICD-10-CM

## 2023-12-06 ENCOUNTER — Other Ambulatory Visit: Payer: Self-pay

## 2023-12-06 DIAGNOSIS — R41840 Attention and concentration deficit: Secondary | ICD-10-CM

## 2023-12-06 MED ORDER — AMPHETAMINE-DEXTROAMPHETAMINE 15 MG PO TABS: 15.0000 mg | ORAL_TABLET | Freq: Every day | ORAL | 0 refills | Status: DC

## 2023-12-18 ENCOUNTER — Other Ambulatory Visit: Payer: Self-pay | Admitting: Family

## 2023-12-18 DIAGNOSIS — R41840 Attention and concentration deficit: Secondary | ICD-10-CM

## 2023-12-20 MED ORDER — AMPHETAMINE-DEXTROAMPHETAMINE 30 MG PO TABS: 30.0000 mg | ORAL_TABLET | Freq: Every day | ORAL | 0 refills | Status: DC

## 2024-01-09 ENCOUNTER — Other Ambulatory Visit: Payer: Self-pay | Admitting: Family

## 2024-01-09 DIAGNOSIS — R41840 Attention and concentration deficit: Secondary | ICD-10-CM

## 2024-01-09 MED ORDER — AMPHETAMINE-DEXTROAMPHETAMINE 15 MG PO TABS: 15.0000 mg | ORAL_TABLET | Freq: Every day | ORAL | 0 refills | Status: DC

## 2024-01-19 ENCOUNTER — Other Ambulatory Visit: Payer: Self-pay

## 2024-01-19 DIAGNOSIS — R41840 Attention and concentration deficit: Secondary | ICD-10-CM

## 2024-01-22 ENCOUNTER — Other Ambulatory Visit: Payer: Self-pay

## 2024-01-22 ENCOUNTER — Encounter: Payer: Self-pay | Admitting: Family

## 2024-01-22 DIAGNOSIS — R41840 Attention and concentration deficit: Secondary | ICD-10-CM

## 2024-01-22 MED ORDER — AMPHETAMINE-DEXTROAMPHETAMINE 30 MG PO TABS: 30.0000 mg | ORAL_TABLET | Freq: Every day | ORAL | 0 refills | Status: DC

## 2024-02-12 ENCOUNTER — Other Ambulatory Visit: Payer: Self-pay

## 2024-02-12 DIAGNOSIS — R41840 Attention and concentration deficit: Secondary | ICD-10-CM

## 2024-02-12 MED ORDER — AMPHETAMINE-DEXTROAMPHETAMINE 15 MG PO TABS: 15.0000 mg | ORAL_TABLET | Freq: Every day | ORAL | 0 refills | Status: DC

## 2024-02-21 ENCOUNTER — Encounter: Payer: Self-pay | Admitting: Family

## 2024-02-21 DIAGNOSIS — R41840 Attention and concentration deficit: Secondary | ICD-10-CM

## 2024-02-26 ENCOUNTER — Other Ambulatory Visit: Payer: Self-pay

## 2024-02-26 ENCOUNTER — Encounter: Payer: Self-pay | Admitting: Family

## 2024-02-26 DIAGNOSIS — R41840 Attention and concentration deficit: Secondary | ICD-10-CM

## 2024-02-26 MED ORDER — AMPHETAMINE-DEXTROAMPHETAMINE 30 MG PO TABS: 30.0000 mg | ORAL_TABLET | Freq: Every day | ORAL | 0 refills | Status: DC

## 2024-03-14 ENCOUNTER — Encounter: Payer: Self-pay | Admitting: Family

## 2024-03-14 ENCOUNTER — Other Ambulatory Visit: Payer: Self-pay

## 2024-03-14 DIAGNOSIS — R41840 Attention and concentration deficit: Secondary | ICD-10-CM

## 2024-03-15 MED ORDER — AMPHETAMINE-DEXTROAMPHETAMINE 15 MG PO TABS
15.0000 mg | ORAL_TABLET | Freq: Every day | ORAL | 0 refills | Status: DC
Start: 2024-03-15 — End: 2024-04-12

## 2024-03-22 ENCOUNTER — Other Ambulatory Visit: Payer: Self-pay

## 2024-03-22 DIAGNOSIS — R41840 Attention and concentration deficit: Secondary | ICD-10-CM

## 2024-03-28 ENCOUNTER — Other Ambulatory Visit: Payer: Self-pay

## 2024-03-28 DIAGNOSIS — R41840 Attention and concentration deficit: Secondary | ICD-10-CM

## 2024-03-30 MED ORDER — AMPHETAMINE-DEXTROAMPHETAMINE 30 MG PO TABS
30.0000 mg | ORAL_TABLET | Freq: Every day | ORAL | 0 refills | Status: DC
Start: 2024-03-30 — End: 2024-05-08

## 2024-04-12 ENCOUNTER — Encounter: Payer: Self-pay | Admitting: Family

## 2024-04-12 ENCOUNTER — Other Ambulatory Visit: Payer: Self-pay

## 2024-04-12 DIAGNOSIS — R41840 Attention and concentration deficit: Secondary | ICD-10-CM

## 2024-04-16 MED ORDER — AMPHETAMINE-DEXTROAMPHETAMINE 15 MG PO TABS
15.0000 mg | ORAL_TABLET | Freq: Every day | ORAL | 0 refills | Status: DC
Start: 2024-04-16 — End: 2024-05-08

## 2024-04-29 ENCOUNTER — Encounter: Payer: Self-pay | Admitting: Family

## 2024-04-29 DIAGNOSIS — R41840 Attention and concentration deficit: Secondary | ICD-10-CM

## 2024-05-08 ENCOUNTER — Ambulatory Visit: Payer: Self-pay | Admitting: Family

## 2024-05-08 ENCOUNTER — Encounter: Payer: Self-pay | Admitting: Family

## 2024-05-08 VITALS — BP 121/86 | HR 88 | Ht 66.0 in | Wt 148.8 lb

## 2024-05-08 DIAGNOSIS — R41840 Attention and concentration deficit: Secondary | ICD-10-CM

## 2024-05-08 DIAGNOSIS — Z79899 Other long term (current) drug therapy: Secondary | ICD-10-CM

## 2024-05-08 MED ORDER — AMPHETAMINE-DEXTROAMPHETAMINE 15 MG PO TABS: 15.0000 mg | ORAL_TABLET | Freq: Every day | ORAL | 0 refills | Status: DC

## 2024-05-08 MED ORDER — AMPHETAMINE-DEXTROAMPHETAMINE 30 MG PO TABS: 30.0000 mg | ORAL_TABLET | Freq: Every day | ORAL | 0 refills | Status: DC

## 2024-05-11 LAB — TOXASSURE SELECT 13 (MW), URINE

## 2024-05-12 NOTE — Assessment & Plan Note (Signed)
 UDS ordered today.  Will call with results when available.  Patient stable on current dosing.  Sending Refills.   Will reassess at follow up.

## 2024-05-12 NOTE — Progress Notes (Signed)
 Established Patient Office Visit  Subjective:  Patient ID: Megan Floyd, female    DOB: May 29, 1991  Age: 33 y.o. MRN: [id number]  Chief Complaint  Patient presents with   Follow-up    Med refills     Patient is here today for her 3 months follow up.  She has been feeling well since last appointment.   She does not have additional concerns to discuss today.  Labs are not due today.  She needs refills.   I have reviewed her active problem list, medication list, allergies, notes from last encounter, lab results for her appointment today.      No other concerns at this time.   Past Medical History:  Diagnosis Date   ADHD    Anxiety    Depression    Sepsis secondary to UTI (HCC) 12/01/2016    Past Surgical History:  Procedure Laterality Date   TONSILLECTOMY      Social History   Socioeconomic History   Marital status: Single    Spouse name: Not on file   Number of children: Not on file   Years of education: Not on file   Highest education level: Not on file  Occupational History   Not on file  Tobacco Use   Smoking status: Never   Smokeless tobacco: Never  Vaping Use   Vaping status: Never Used  Substance and Sexual Activity   Alcohol use: Yes    Comment: ocassionally   Drug use: No   Sexual activity: Yes    Partners: Male    Birth control/protection: None  Other Topics Concern   Not on file  Social History Narrative   Not on file   Social Drivers of Health   Financial Resource Strain: Not on file  Food Insecurity: Not on file  Transportation Needs: Not on file  Physical Activity: Not on file  Stress: Not on file  Social Connections: Not on file  Intimate Partner Violence: Not on file    History reviewed. No pertinent family history.  No Known Allergies  Review of Systems  All other systems reviewed and are negative.      Objective:   BP 121/86   Pulse 88   Ht 5' 6 (1.676 m)   Wt 148 lb 12.8 oz (67.5 kg)   SpO2 97%   BMI  24.02 kg/m   Vitals:   05/08/24 1458  BP: 121/86  Pulse: 88  Height: 5' 6 (1.676 m)  Weight: 148 lb 12.8 oz (67.5 kg)  SpO2: 97%  BMI (Calculated): 24.03    Physical Exam Vitals and nursing note reviewed.  Constitutional:      Appearance: Normal appearance. She is normal weight.  HENT:     Head: Normocephalic.  Eyes:     Extraocular Movements: Extraocular movements intact.     Conjunctiva/sclera: Conjunctivae normal.     Pupils: Pupils are equal, round, and reactive to light.  Cardiovascular:     Rate and Rhythm: Normal rate.  Pulmonary:     Effort: Pulmonary effort is normal.  Neurological:     General: No focal deficit present.     Mental Status: She is alert and oriented to person, place, and time. Mental status is at baseline.  Psychiatric:        Mood and Affect: Mood normal.        Behavior: Behavior normal.        Thought Content: Thought content normal.      Results for orders  placed or performed in visit on 05/08/24  [facility] (MW), Urine  Result Value Ref Range   Summary FINAL     Recent Results (from the past 2160 hours)  [employer] 13 (MW), Urine     Status: None   Collection Time: 05/08/24  3:20 PM  Result Value Ref Range   Summary FINAL     Comment: ==================================================================== [facility] (MW) ==================================================================== Test                             Result       Flag       Units  Drug Present   Amphetamine                     3666                    ng/mg creat    Amphetamine  is available as a schedule II prescription drug.    Carboxy-THC                    223                     ng/mg creat    Carboxy-THC is a metabolite of tetrahydrocannabinol (THC). Source of    THC is most commonly herbal marijuana or marijuana-based products,    but THC is also present in a scheduled prescription medication.    Trace amounts of THC can be  present in hemp and cannabidiol (CBD)    products. This test is not intended to distinguish between delta-9-    tetrahydrocannabinol, the predominant form of THC in most herbal or    marijuana-based products, and delta-8-tetrahydrocannabinol.  ======================================================= ============= Test                      Result    Flag   Units      Ref Range   Creatinine              149              mg/dL      >=79 ==================================================================== Declared Medications:  Medication list was not provided. ==================================================================== For clinical consultation, please call [phone]. ====================================================================        Assessment & Plan Attention and concentration deficit Long term current use of therapeutic drug UDS ordered today.  Will call with results when available.  Patient stable on current dosing.  Sending Refills.   Will reassess at follow up.    Return in about 3 months (around 08/07/2024).   Total time spent: 20 minutes  ALAN CHRISTELLA ARRANT, FNP  05/08/2024   This document may have been prepared by [facility] Voice Recognition software and as such may include unintentional dictation errors.

## 2024-05-28 ENCOUNTER — Other Ambulatory Visit: Payer: Self-pay

## 2024-05-28 ENCOUNTER — Encounter: Payer: Self-pay | Admitting: Family

## 2024-05-28 MED ORDER — SPIRONOLACTONE 100 MG PO TABS: 100.0000 mg | ORAL_TABLET | Freq: Every day | ORAL | 1 refills | Status: AC

## 2024-06-03 ENCOUNTER — Other Ambulatory Visit: Payer: Self-pay

## 2024-06-03 ENCOUNTER — Encounter: Payer: Self-pay | Admitting: Family

## 2024-06-03 DIAGNOSIS — R41840 Attention and concentration deficit: Secondary | ICD-10-CM

## 2024-06-05 MED ORDER — AMPHETAMINE-DEXTROAMPHETAMINE 30 MG PO TABS: 30.0000 mg | ORAL_TABLET | Freq: Every day | ORAL | 0 refills | Status: DC

## 2024-06-05 MED ORDER — AMPHETAMINE-DEXTROAMPHETAMINE 15 MG PO TABS: 15.0000 mg | ORAL_TABLET | Freq: Every day | ORAL | 0 refills | Status: DC

## 2024-07-05 ENCOUNTER — Encounter: Payer: Self-pay | Admitting: Family

## 2024-07-05 DIAGNOSIS — R41840 Attention and concentration deficit: Secondary | ICD-10-CM

## 2024-07-09 MED ORDER — AMPHETAMINE-DEXTROAMPHETAMINE 30 MG PO TABS: 30.0000 mg | ORAL_TABLET | Freq: Every day | ORAL | 0 refills | Status: DC

## 2024-07-09 MED ORDER — AMPHETAMINE-DEXTROAMPHETAMINE 15 MG PO TABS: 15.0000 mg | ORAL_TABLET | Freq: Every day | ORAL | 0 refills | Status: DC

## 2024-08-08 ENCOUNTER — Encounter: Payer: Self-pay | Admitting: Family

## 2024-08-08 DIAGNOSIS — R41840 Attention and concentration deficit: Secondary | ICD-10-CM

## 2024-08-08 MED ORDER — AMPHETAMINE-DEXTROAMPHETAMINE 30 MG PO TABS: 30.0000 mg | ORAL_TABLET | Freq: Every day | ORAL | 0 refills | Status: DC

## 2024-08-08 MED ORDER — AMPHETAMINE-DEXTROAMPHETAMINE 15 MG PO TABS: 15.0000 mg | ORAL_TABLET | Freq: Every day | ORAL | 0 refills | Status: DC

## 2024-08-14 ENCOUNTER — Ambulatory Visit: Payer: Self-pay | Admitting: Family

## 2024-09-10 ENCOUNTER — Encounter: Payer: Self-pay | Admitting: Family

## 2024-09-10 DIAGNOSIS — R41840 Attention and concentration deficit: Secondary | ICD-10-CM

## 2024-09-13 ENCOUNTER — Encounter: Payer: Self-pay | Admitting: Family

## 2024-09-15 MED ORDER — AMPHETAMINE-DEXTROAMPHETAMINE 15 MG PO TABS: 15.0000 mg | ORAL_TABLET | Freq: Every day | ORAL | 0 refills | Status: AC

## 2024-09-15 MED ORDER — AMPHETAMINE-DEXTROAMPHETAMINE 30 MG PO TABS: 30.0000 mg | ORAL_TABLET | Freq: Every day | ORAL | 0 refills | Status: AC
